# Patient Record
Sex: Male | Born: 1965
Health system: Southern US, Community
[De-identification: ages and names within clinical notes are randomized; demographics above are authoritative.]

## PROBLEM LIST (undated history)

## (undated) DIAGNOSIS — I10 Essential (primary) hypertension: Secondary | ICD-10-CM

## (undated) DIAGNOSIS — G8929 Other chronic pain: Secondary | ICD-10-CM

## (undated) DIAGNOSIS — M549 Dorsalgia, unspecified: Secondary | ICD-10-CM

## (undated) DIAGNOSIS — E785 Hyperlipidemia, unspecified: Secondary | ICD-10-CM

## (undated) HISTORY — PX: HERNIA REPAIR: SHX51

## (undated) HISTORY — PX: APPENDECTOMY: SHX54

## (undated) HISTORY — DX: Essential (primary) hypertension: I10

## (undated) HISTORY — DX: Hyperlipidemia, unspecified: E78.5

## (undated) HISTORY — DX: Dorsalgia, unspecified: M54.9

## (undated) HISTORY — DX: Other chronic pain: G89.29

---

## 1998-07-08 ENCOUNTER — Encounter: Payer: Self-pay | Admitting: Emergency Medicine

## 1998-07-08 ENCOUNTER — Emergency Department (HOSPITAL_COMMUNITY): Admission: EM | Admit: 1998-07-08 | Discharge: 1998-07-08 | Payer: Self-pay | Admitting: Emergency Medicine

## 1999-06-15 ENCOUNTER — Encounter: Payer: Self-pay | Admitting: Emergency Medicine

## 1999-06-15 ENCOUNTER — Emergency Department (HOSPITAL_COMMUNITY): Admission: EM | Admit: 1999-06-15 | Discharge: 1999-06-15 | Payer: Self-pay | Admitting: Emergency Medicine

## 2015-09-12 ENCOUNTER — Telehealth: Payer: Self-pay | Admitting: Internal Medicine

## 2015-09-12 NOTE — Telephone Encounter (Signed)
REMINDER CALL FOR  APPT/PHONE IS OFF

## 2015-09-15 ENCOUNTER — Encounter: Payer: Self-pay | Admitting: Internal Medicine

## 2015-09-15 ENCOUNTER — Ambulatory Visit (INDEPENDENT_AMBULATORY_CARE_PROVIDER_SITE_OTHER): Payer: Self-pay | Admitting: Internal Medicine

## 2015-09-15 VITALS — BP 148/93 | HR 72 | Temp 98.3°F | Ht 73.5 in | Wt 228.7 lb

## 2015-09-15 DIAGNOSIS — M549 Dorsalgia, unspecified: Secondary | ICD-10-CM

## 2015-09-15 DIAGNOSIS — R21 Rash and other nonspecific skin eruption: Secondary | ICD-10-CM | POA: Insufficient documentation

## 2015-09-15 DIAGNOSIS — Z0000 Encounter for general adult medical examination without abnormal findings: Secondary | ICD-10-CM

## 2015-09-15 DIAGNOSIS — R3911 Hesitancy of micturition: Secondary | ICD-10-CM

## 2015-09-15 DIAGNOSIS — Z1211 Encounter for screening for malignant neoplasm of colon: Secondary | ICD-10-CM | POA: Insufficient documentation

## 2015-09-15 DIAGNOSIS — R300 Dysuria: Secondary | ICD-10-CM

## 2015-09-15 DIAGNOSIS — I10 Essential (primary) hypertension: Secondary | ICD-10-CM | POA: Insufficient documentation

## 2015-09-15 DIAGNOSIS — Z23 Encounter for immunization: Secondary | ICD-10-CM

## 2015-09-15 DIAGNOSIS — M79672 Pain in left foot: Secondary | ICD-10-CM

## 2015-09-15 DIAGNOSIS — G8929 Other chronic pain: Secondary | ICD-10-CM

## 2015-09-15 MED ORDER — HYDROCHLOROTHIAZIDE 12.5 MG PO CAPS: 12.5000 mg | ORAL_CAPSULE | Freq: Every day | ORAL | Status: DC

## 2015-09-15 MED ORDER — MELOXICAM 7.5 MG PO TABS: 7.5000 mg | ORAL_TABLET | Freq: Every day | ORAL | Status: DC | PRN

## 2015-09-15 MED ORDER — MELOXICAM 7.5 MG PO TABS: 7.5000 mg | ORAL_TABLET | Freq: Every day | ORAL | Status: AC | PRN

## 2015-09-15 MED FILL — HYDROCHLOROTHIAZIDE 12.5 MG: 12.5 | 30 days supply | Qty: 30 | Fill #0

## 2015-09-15 MED FILL — MELOXICAM 7.5 MG TABLET: 7.5 | 30 days supply | Qty: 30 | Fill #0

## 2015-09-15 NOTE — Assessment & Plan Note (Addendum)
HIV negative. Given flu shot today.

## 2015-09-15 NOTE — Patient Instructions (Addendum)
1. Please make a follow up appointment in 4 weeks.   2. Please take all medications as previously prescribed with the following changes:  Start HCTZ 12.5 mg daily for your blood pressure. I sent this prescription to [facility] on [address] in [place].   I will call you if there are any abnormalities with your labs. Otherwise, we will dis cuss them at your next clinic visit in 1 month.   Please take Mobic 7.5 mg daily AS NEEDED for back and foot pain. You can take Tylenol over the counter as needed for pain as well.   It is not safe to take 7-8 Advil daily, this can cause kidney and stomach problems.   3. If you have worsening of your symptoms or new symptoms arise, please call the clinic [phone]), or go to the ER immediately if symptoms are severe.  Hypertension Hypertension is another name for high blood pressure. High blood pressure forces your heart to work harder to pump blood. A blood pressure reading has two numbers, which includes a higher number over a lower number (example: 110/72). HOME CARE   Have your blood pressure rechecked by your doctor.  Only take medicine as told by your doctor. Follow the directions carefully. The medicine does not work as well if you skip doses. Skipping doses also puts you at risk for problems.  Do not smoke.  Monitor your blood pressure at home as told by your doctor. GET HELP IF:  You think you are having a reaction to the medicine you are taking.  You have repeat headaches or feel dizzy.  You have puffiness (swelling) in your ankles.  You have trouble with your vision. GET HELP RIGHT AWAY IF:   You get a very bad headache and are confused.  You feel weak, numb, or faint.  You get chest or belly (abdominal) pain.  You throw up (vomit).  You cannot breathe very well. MAKE SURE YOU:   Understand these instructions.  Will watch your condition.  Will get help right away if you are not doing  well or get worse.   This information is not intended to replace advice given to you by your health care provider. Make sure you discuss any questions you have with your health care provider.   Document Released: 01/12/2008 Document Revised: 07/31/2013 Document Reviewed: 05/18/2013 Elsevier Interactive Patient Education 2016 [employer].  Smoking Cessation, Tips for Success If you are ready to quit smoking, congratulations! You have chosen to help yourself be healthier. Cigarettes bring nicotine, tar, carbon monoxide, and other irritants into your body. Your lungs, heart, and blood vessels will be able to work better without these poisons. There are many different ways to quit smoking. Nicotine gum, nicotine patches, a nicotine inhaler, or nicotine nasal spray can help with physical craving. Hypnosis, support groups, and medicines help break the habit of smoking. WHAT THINGS CAN I DO TO MAKE QUITTING EASIER?  Here are some tips to help you quit for good:  Pick a date when you will quit smoking completely. Tell all of your friends and family about your plan to quit on that date.  Do not try to slowly cut down on the number of cigarettes you are smoking. Pick a quit date and quit smoking completely starting on that day.  Throw away all cigarettes.   Clean and remove all ashtrays from your home, work, and car.  On a card, write down your reasons for quitting. Carry  the card with you and read it when you get the urge to smoke.  Cleanse your body of nicotine. Drink enough water and fluids to keep your urine clear or pale yellow. Do this after quitting to flush the nicotine from your body.  Learn to predict your moods. Do not let a bad situation be your excuse to have a cigarette. Some situations in your life might tempt you into wanting a cigarette.  Never have "just one" cigarette. It leads to wanting another and another. Remind yourself of your decision to quit.  Change habits associated  with smoking. If you smoked while driving or when feeling stressed, try other activities to replace smoking. Stand up when drinking your coffee. Brush your teeth after eating. Sit in a different chair when you read the paper. Avoid alcohol while trying to quit, and try to drink fewer caffeinated beverages. Alcohol and caffeine may urge you to smoke.  Avoid foods and drinks that can trigger a desire to smoke, such as sugary or spicy foods and alcohol.  Ask people who smoke not to smoke around you.  Have something planned to do right after eating or having a cup of coffee. For example, plan to take a walk or exercise.  Try a relaxation exercise to calm you down and decrease your stress. Remember, you may be tense and nervous for the first 2 weeks after you quit, but this will pass.  Find new activities to keep your hands busy. Play with a pen, coin, or rubber band. Doodle or draw things on paper.  Brush your teeth right after eating. This will help cut down on the craving for the taste of tobacco after meals. You can also try mouthwash.   Use oral substitutes in place of cigarettes. Try using lemon drops, carrots, cinnamon sticks, or chewing gum. Keep them handy so they are available when you have the urge to smoke.  When you have the urge to smoke, try deep breathing.  Designate your home as a nonsmoking area.  If you are a heavy smoker, ask your health care provider about a prescription for nicotine chewing gum. It can ease your withdrawal from nicotine.  Reward yourself. Set aside the cigarette money you save and buy yourself something nice.  Look for support from others. Join a support group or smoking cessation program. Ask someone at home or at work to help you with your plan to quit smoking.  Always ask yourself, "Do I need this cigarette or is this just a reflex?" Tell yourself, "Today, I choose not to smoke," or "I do not want to smoke." You are reminding yourself of your decision  to quit.  Do not replace cigarette smoking with electronic cigarettes (commonly called e-cigarettes). The safety of e-cigarettes is unknown, and some may contain harmful chemicals.  If you relapse, do not give up! Plan ahead and think about what you will do the next time you get the urge to smoke. HOW WILL I FEEL WHEN I QUIT SMOKING? You may have symptoms of withdrawal because your body is used to nicotine (the addictive substance in cigarettes). You may crave cigarettes, be irritable, feel very hungry, cough often, get headaches, or have difficulty concentrating. The withdrawal symptoms are only temporary. They are strongest when you first quit but will go away within 10-14 days. When withdrawal symptoms occur, stay in control. Think about your reasons for quitting. Remind yourself that these are signs that your body is healing and getting used to being  without cigarettes. Remember that withdrawal symptoms are easier to treat than the major diseases that smoking can cause.  Even after the withdrawal is over, expect periodic urges to smoke. However, these cravings are generally short lived and will go away whether you smoke or not. Do not smoke! WHAT RESOURCES ARE AVAILABLE TO HELP ME QUIT SMOKING? Your health care provider can direct you to community resources or hospitals for support, which may include:  Group support.  Education.  Hypnosis.  Therapy.   This information is not intended to replace advice given to you by your health care provider. Make sure you discuss any questions you have with your health care provider.   Document Released: 04/23/2004 Document Revised: 08/16/2014 Document Reviewed: 01/11/2013 Elsevier Interactive Patient Education [employer].

## 2015-09-15 NOTE — Assessment & Plan Note (Addendum)
Patient states he has had 1 week of dysuria w/ hesitancy. Has not had previously. Sexually active as recently as 3 months ago. Claims he has had some urethral discharge as well. UA shows 11-20 WBC's, RBC's, crystals. No squamous cells. Will treat for UTI at this time. -Send for urine culture -Start bactrim DS bid for 7 days -GC/chlamydia, HIV negative -Oral hydration given crystals

## 2015-09-15 NOTE — Assessment & Plan Note (Addendum)
BP Readings from Last 3 Encounters:  09/15/15 148/93    Lab Results  Component Value Date   NA 142 09/15/2015   K 4.3 09/15/2015   CREATININE 0.85 09/15/2015    Assessment: Blood pressure control:  Mild elevation. Has been told he has had high blood pressure in the past.   Plan: Medications:  Start HCTZ 12.5 mg daily. Renal function normal.  Educational resources provided:   Info packet with AVS Other plans: RTC in 4 weeks.

## 2015-09-15 NOTE — Progress Notes (Signed)
   Subjective:   Patient ID: Edward Franklin male   DOB: [birth year] 50 y.o.   MRN: [id number]  HPI: Edward Franklin is a 50 y.o. male w/ PMHx of chronic back pain and HTN (not on any medications), presents to the clinic today to establish care for management of his HTN. Today, he says he is feeling well, has chronic back pain which he manages with daily NSAIDS, seems to take 6-8 Advil a day per the patient. Walks with a cane. 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 Otherwise, he says he started having dysuria which began about 1 week ago. He states he is having some issues with hesitancy as well, which also started over the same period of time. Also claims he has had some whitish appearing discharge as well. Was sexually active with one partner 2-3 months ago. No fever, chills, flank pain, nausea, or vomiting. Has never hd a UTI before. Also states he has had a h/o HTN in the past but has never taken medications for this.   Patient smokes 7-10 cigarettes daily, does not drink or use recreational drugs. No significant family history.    Past Medical History  Diagnosis Date  . Hypertension   . Chronic back pain      Current Outpatient Prescriptions  Medication Sig Dispense Refill  . hydrochlorothiazide (MICROZIDE) 12.5 MG capsule Take 1 capsule (12.5 mg total) by mouth daily. 30 capsule 5  . meloxicam (MOBIC) 7.5 MG tablet Take 1 tablet (7.5 mg total) by mouth daily as needed for pain. 30 tablet 2  . sulfamethoxazole-trimethoprim (BACTRIM DS,SEPTRA DS) 800-160 MG tablet Take 1 tablet by mouth 2 (two) times daily. 14 tablet 0   No current facility-administered medications for this visit.    Review of Systems: General: Denies fever, chills, diaphoresis, appetite change and fatigue.  Respiratory: Denies SOB, DOE, cough, and wheezing.   Cardiovascular: Denies chest pain and palpitations.    Gastrointestinal: Denies nausea, vomiting, abdominal pain, and diarrhea.  Genitourinary: Positive for dysuria and hesitancy. Denies increased frequency, and flank pain. Endocrine: Denies hot or cold intolerance, polyuria, and polydipsia. Musculoskeletal: Positive for chronic back pain and gait problem. Denies myalgias, joint swelling, arthralgias.  Skin: Denies pallor, rash and wounds.  Neurological: Denies dizziness, seizures, syncope, weakness, lightheadedness, numbness and headaches.  Psychiatric/Behavioral: Denies mood changes, and sleep disturbances.  Objective:   Physical Exam: Filed Vitals:   09/15/15 1547  BP: 148/93  Pulse: 72  Temp: 98.3 F (36.8 C)  TempSrc: Oral  Height: 6' 1.5" (1.867 m)  Weight: 228 lb 11.2 oz (103.738 kg)  SpO2: 97%    General: Alert, cooperative, NAD. HEENT: PERRL, EOMI. Moist mucus membranes Neck: Full range of motion without pain, supple, no lymphadenopathy or carotid bruits Lungs: Clear to ascultation bilaterally, normal work of respiration, no wheezes, rales, rhonchi Heart: RRR, no murmurs, gallops, or rubs Abdomen: Soft, non-tender, non-distended, BS + Back:  Mild central tenderness over the lumbar spine. No surgical incisions.  Extremities: No cyanosis, clubbing, or edema. Left foot with multiple old surgical scars. Neurologic: Alert & oriented x3, cranial nerves II-XII intact, strength grossly intact, sensation intact to light touch   Assessment & Plan:   Please see problem based assessment and plan.

## 2015-09-16 ENCOUNTER — Encounter: Payer: Self-pay | Admitting: Internal Medicine

## 2015-09-16 LAB — BMP8+ANION GAP
Anion Gap: 20 mmol/L — ABNORMAL HIGH (ref 10.0–18.0)
BUN/Creatinine Ratio: 14 (ref 9–20)
BUN: 12 mg/dL (ref 6–24)
CO2: 22 mmol/L (ref 18–29)
Calcium: 9.4 mg/dL (ref 8.7–10.2)
Chloride: 100 mmol/L (ref 96–106)
Creatinine, Ser: 0.85 mg/dL (ref 0.76–1.27)
GFR calc Af Amer: 117 mL/min/{1.73_m2} (ref >59)
GFR calc non Af Amer: 102 mL/min/{1.73_m2} (ref >59)
Glucose: 92 mg/dL (ref 65–99)
Potassium: 4.3 mmol/L (ref 3.5–5.2)
Sodium: 142 mmol/L (ref 134–144)

## 2015-09-16 LAB — CBC WITH DIFFERENTIAL/PLATELET
Basophils Absolute: 0.1 10*3/uL (ref 0.0–0.2)
Basos: 1 %
EOS (ABSOLUTE): 0.2 10*3/uL (ref 0.0–0.4)
Eos: 2 %
Hematocrit: 43.2 % (ref 37.5–51.0)
Hemoglobin: 14.9 g/dL (ref 12.6–17.7)
Immature Grans (Abs): 0 10*3/uL (ref 0.0–0.1)
Immature Granulocytes: 0 %
Lymphocytes Absolute: 4 10*3/uL — ABNORMAL HIGH (ref 0.7–3.1)
Lymphs: 38 %
MCH: 32 pg (ref 26.6–33.0)
MCHC: 34.5 g/dL (ref 31.5–35.7)
MCV: 93 fL (ref 79–97)
Monocytes Absolute: 0.7 10*3/uL (ref 0.1–0.9)
Monocytes: 6 %
Neutrophils Absolute: 5.5 10*3/uL (ref 1.4–7.0)
Neutrophils: 53 %
Platelets: 316 10*3/uL (ref 150–379)
RBC: 4.66 x10E6/uL (ref 4.14–5.80)
RDW: 12.5 % (ref 12.3–15.4)
WBC: 10.5 10*3/uL (ref 3.4–10.8)

## 2015-09-16 LAB — URINE CYTOLOGY ANCILLARY ONLY
CHLAMYDIA, DNA PROBE: NEGATIVE
Neisseria Gonorrhea: NEGATIVE

## 2015-09-16 LAB — MICROSCOPIC EXAMINATION
Casts: NONE SEEN /lpf
Epithelial Cells (non renal): NONE SEEN /hpf (ref 0–10)

## 2015-09-16 LAB — URINALYSIS, ROUTINE W REFLEX MICROSCOPIC
Bilirubin, UA: NEGATIVE
Glucose, UA: NEGATIVE
Ketones, UA: NEGATIVE
Nitrite, UA: NEGATIVE
Protein, UA: NEGATIVE
Specific Gravity, UA: 1.027 (ref 1.005–1.030)
Urobilinogen, Ur: 0.2 mg/dL (ref 0.2–1.0)
pH, UA: 5.5 (ref 5.0–7.5)

## 2015-09-16 LAB — HIV ANTIBODY (ROUTINE TESTING W REFLEX): HIV Screen 4th Generation wRfx: NONREACTIVE

## 2015-09-16 MED ORDER — SULFAMETHOXAZOLE-TRIMETHOPRIM 800-160 MG PO TABS: 1.0000 | ORAL_TABLET | Freq: Two times a day (BID) | ORAL | Status: DC

## 2015-09-16 NOTE — Assessment & Plan Note (Addendum)
Patient states that he has had back pain for many years, after a work related accident. He takes excessive NSAIDS daily to help control the pain. States he takes 6-8 Advil every day. He feels this does in fact control the pain. Patient only with mild tenderness to palpation over the lumbar spine. No significant limitations to ROM.  -Discussed decreasing NSAID use  -Start Mobic 7.5 mg daily prn for back pain -Discussed using Tylenol prn OTC for pain other than excessive NSAID use.

## 2015-09-17 LAB — SPECIMEN STATUS REPORT

## 2015-09-18 NOTE — Progress Notes (Signed)
Internal Medicine Clinic Attending  Case discussed with Dr. Jones at the time of the visit.  We reviewed the resident's history and exam and pertinent patient test results.  I agree with the assessment, diagnosis, and plan of care documented in the resident's note.  

## 2015-09-19 LAB — URINE CULTURE

## 2015-10-06 ENCOUNTER — Encounter: Payer: Self-pay | Admitting: Internal Medicine

## 2015-10-06 ENCOUNTER — Ambulatory Visit (INDEPENDENT_AMBULATORY_CARE_PROVIDER_SITE_OTHER): Payer: Self-pay | Admitting: Internal Medicine

## 2015-10-06 ENCOUNTER — Ambulatory Visit: Payer: Self-pay

## 2015-10-06 VITALS — BP 143/86 | HR 74 | Temp 98.0°F | Ht 73.0 in | Wt 228.0 lb

## 2015-10-06 DIAGNOSIS — I10 Essential (primary) hypertension: Secondary | ICD-10-CM

## 2015-10-06 DIAGNOSIS — R202 Paresthesia of skin: Secondary | ICD-10-CM

## 2015-10-06 DIAGNOSIS — E785 Hyperlipidemia, unspecified: Secondary | ICD-10-CM | POA: Insufficient documentation

## 2015-10-06 DIAGNOSIS — G5690 Unspecified mononeuropathy of unspecified upper limb: Secondary | ICD-10-CM | POA: Insufficient documentation

## 2015-10-06 DIAGNOSIS — B351 Tinea unguium: Secondary | ICD-10-CM

## 2015-10-06 DIAGNOSIS — R200 Anesthesia of skin: Secondary | ICD-10-CM

## 2015-10-06 MED ORDER — HYDROCHLOROTHIAZIDE 12.5 MG PO CAPS: 25.0000 mg | ORAL_CAPSULE | Freq: Every day | ORAL | Status: DC

## 2015-10-06 MED ORDER — TERBINAFINE HCL 250 MG PO TABS: 250.0000 mg | ORAL_TABLET | Freq: Every day | ORAL | Status: DC

## 2015-10-06 MED ORDER — HYDROCHLOROTHIAZIDE 25 MG PO TABS: 25.0000 mg | ORAL_TABLET | Freq: Every day | ORAL | Status: DC

## 2015-10-06 NOTE — Patient Instructions (Signed)
1. Please make a follow up appointment for 6 weeks.   2. Please take all medications as previously prescribed with the following changes:  Increase HCTZ to 25 mg daily (2 pills once daily)  Start taking Terbinafine 250 mg daily for a total of 12 weeks.   If any of your labs come back abnormal I will call you.   3. If you have worsening of your symptoms or new symptoms arise, please call the clinic [phone]), or go to the ER immediately if symptoms are severe.  You have done a great job in taking all your medications. Please continue to do this.

## 2015-10-07 ENCOUNTER — Encounter: Payer: Self-pay | Admitting: Internal Medicine

## 2015-10-07 LAB — LIPID PANEL
CHOL/HDL RATIO: 8.3 ratio — AB (ref 0.0–5.0)
Cholesterol, Total: 217 mg/dL — ABNORMAL HIGH (ref 100–199)
HDL: 26 mg/dL — AB (ref >39)
LDL Calculated: 127 mg/dL — ABNORMAL HIGH (ref 0–99)
Triglycerides: 318 mg/dL — ABNORMAL HIGH (ref 0–149)
VLDL Cholesterol Cal: 64 mg/dL — ABNORMAL HIGH (ref 5–40)

## 2015-10-07 LAB — TSH: TSH: 0.986 u[IU]/mL (ref 0.450–4.500)

## 2015-10-07 LAB — VITAMIN B12: VITAMIN B 12: 400 pg/mL (ref 211–946)

## 2015-10-07 NOTE — Assessment & Plan Note (Signed)
Has thickening of 1-2 fingernails with discoloration, as well as 1-2 toenails as well. Says this has been present for quite some time.  -Start Terbinafine 250 mg daily  for 12 week course (given presence of both finger and toenail involvement) -RTC in 6 weeks -Patient understands he may not see benefit for some time

## 2015-10-07 NOTE — Progress Notes (Signed)
   Subjective:   Patient ID: Edward Franklin male   DOB: 1966/03/19 50 y.o.   MRN: [id number]  HPI: [name] is a 50 y.o. male w/ PMHx of chronic back pain, HLD, and HTN (not on any medications), presents to the clinic today for follow up of his HTN. Patient was seen on 09/15/15 as a new patient in the clinic, at which time he was started on HCTZ 12.5 mg daily. Today, he says he has tolerated this medication well, BP still mild/moderately elevated.   He states he has ben taking Mobic intermittently and says that he has stopped taking OTC NSAIDS and doing better off of this. Back pain is controlled.   He also admits to some tingling and numbness in his hands bilaterally. No back pain or weakness. No swelling. Says this has been present for some time, comes and goes, not associated with anything in particular.    Past Medical History  Diagnosis Date  . Hypertension   . Chronic back pain   . Hyperlipidemia      Current Outpatient Prescriptions  Medication Sig Dispense Refill  . hydrochlorothiazide (HYDRODIURIL) 25 MG tablet Take 1 tablet (25 mg total) by mouth daily. 30 tablet 5  . meloxicam (MOBIC) 7.5 MG tablet Take 1 tablet (7.5 mg total) by mouth daily as needed for pain. 30 tablet 2  . terbinafine (LAMISIL) 250 MG tablet Take 1 tablet (250 mg total) by mouth daily. For 12 weeks 120 tablet 0   No current facility-administered medications for this visit.    Review of Systems  General: Denies fever, diaphoresis, appetite change, and fatigue.  Respiratory: Denies SOB, cough, and wheezing.   Cardiovascular: Denies chest pain and palpitations.  Gastrointestinal: Denies nausea, vomiting, abdominal pain, and diarrhea Musculoskeletal: Denies myalgias, arthralgias, back pain, and gait problem.  Neurological: Positive for hand paresthesias. Denies dizziness, syncope, weakness, lightheadedness, and headaches.  Psychiatric/Behavioral: Denies mood changes, sleep disturbance, and  agitation.   Objective:   Physical Exam: Filed Vitals:   10/06/15 1424  BP: 143/86  Pulse: 74  Temp: 98 F (36.7 C)  TempSrc: Oral  Height: 6\' 1"  (1.854 m)  Weight: 228 lb (103.42 kg)  SpO2: 98%    General: Alert, cooperative, NAD. Walks with a cane.  HEENT: PERRL, EOMI. Moist mucus membranes Neck: Full range of motion without pain, supple, no lymphadenopathy or carotid bruits Lungs: Clear to ascultation bilaterally, normal work of respiration, no wheezes, rales, rhonchi Heart: RRR, no murmurs, gallops, or rubs Abdomen: Soft, non-tender, non-distended, BS + Back:  Mild central tenderness over the lumbar spine. No surgical incisions.  Extremities: No cyanosis, clubbing, or edema. Left foot with multiple old surgical scars. Neurologic: Alert & oriented x3, cranial nerves II-XII intact, strength grossly intact, sensation intact to light touch   Assessment & Plan:   Please see problem based assessment and plan.

## 2015-10-07 NOTE — Assessment & Plan Note (Signed)
BP Readings from Last 3 Encounters:  10/06/15 143/86  09/15/15 148/93    Lab Results  Component Value Date   NA 142 09/15/2015   K 4.3 09/15/2015   CREATININE 0.85 09/15/2015    Assessment: Blood pressure control:  Mild elevation Comments: Recently started on HCTZ 12.5 mg daily. Has been compliant with this since his last visit.   Plan: Medications:  Increase HCTZ to 25 mg daily.  Other plans: RTC in 6 weeks. May need another agent. Also suspect his BP will improve with decreased NSAID use.

## 2015-10-07 NOTE — Assessment & Plan Note (Signed)
Description very non-specific, says he gets some numbness in his fingers sometimes for hours at a time, which will then go away. No neck pain, arm swelling, or weakness in the UE's. Neuro exam with no focal deficits. Checked B12, TSH, both within normal limits. Glucose controlled on most recent BMP, do not suspect DM type II at this time. No LE neuropathy symptoms.  -Can consider neck imaging to determine presence of degenerative changes and osteophyte formation as this may be related to cervical radiculopathy. If this is the case, may be able to refer to sports medicine/PM&R for injection in the future.

## 2015-10-07 NOTE — Assessment & Plan Note (Signed)
Patient states he has been told he has high cholesterol in the past. Checked lipid profile, results as follows:  Lipid Panel     Component Value Date/Time   CHOL 217* 10/06/2015 1544   TRIG 318* 10/06/2015 1544   HDL 26* 10/06/2015 1544   CHOLHDL 8.3* 10/06/2015 1544   LDLCALC 127* 10/06/2015 1544  Based on BP, total cholesterol, HDL, and smoking history, ASCVD 10 year risk is 22.7%, lifetime risk is 69%. Qualifies for moderate/high intensity statin.  -Discuss starting statin therapy at next clinic visit in 6 weeks.

## 2015-10-08 NOTE — Progress Notes (Signed)
Internal Medicine Clinic Attending  Case discussed with Dr. Jones soon after the resident saw the patient.  We reviewed the resident's history and exam and pertinent patient test results.  I agree with the assessment, diagnosis, and plan of care documented in the resident's note. 

## 2015-10-13 ENCOUNTER — Ambulatory Visit: Payer: Self-pay

## 2015-10-20 ENCOUNTER — Telehealth: Payer: Self-pay | Admitting: Internal Medicine

## 2015-10-20 NOTE — Telephone Encounter (Signed)
APPT. REMINDER CALL, LMTCB °

## 2015-10-21 ENCOUNTER — Ambulatory Visit: Payer: Self-pay

## 2016-07-05 ENCOUNTER — Encounter: Payer: Self-pay | Admitting: Internal Medicine

## 2017-08-17 ENCOUNTER — Ambulatory Visit: Payer: Self-pay

## 2017-09-12 ENCOUNTER — Ambulatory Visit (INDEPENDENT_AMBULATORY_CARE_PROVIDER_SITE_OTHER): Payer: Self-pay | Admitting: Internal Medicine

## 2017-09-12 ENCOUNTER — Other Ambulatory Visit: Payer: Self-pay

## 2017-09-12 ENCOUNTER — Ambulatory Visit: Payer: Medicaid Other

## 2017-09-12 ENCOUNTER — Encounter: Payer: Self-pay | Admitting: Internal Medicine

## 2017-09-12 VITALS — BP 164/102 | HR 80 | Temp 98.1°F | Ht 73.0 in | Wt 227.8 lb

## 2017-09-12 DIAGNOSIS — R5383 Other fatigue: Secondary | ICD-10-CM

## 2017-09-12 DIAGNOSIS — Z79899 Other long term (current) drug therapy: Secondary | ICD-10-CM

## 2017-09-12 DIAGNOSIS — I10 Essential (primary) hypertension: Secondary | ICD-10-CM

## 2017-09-12 DIAGNOSIS — R490 Dysphonia: Secondary | ICD-10-CM

## 2017-09-12 DIAGNOSIS — E785 Hyperlipidemia, unspecified: Secondary | ICD-10-CM

## 2017-09-12 DIAGNOSIS — R12 Heartburn: Secondary | ICD-10-CM

## 2017-09-12 DIAGNOSIS — R093 Abnormal sputum: Secondary | ICD-10-CM

## 2017-09-12 DIAGNOSIS — R05 Cough: Secondary | ICD-10-CM

## 2017-09-12 DIAGNOSIS — R5381 Other malaise: Secondary | ICD-10-CM

## 2017-09-12 DIAGNOSIS — F17210 Nicotine dependence, cigarettes, uncomplicated: Secondary | ICD-10-CM

## 2017-09-12 MED ORDER — HYDROCHLOROTHIAZIDE 25 MG PO TABS: 25.0000 mg | ORAL_TABLET | Freq: Every day | ORAL | 0 refills | Status: DC

## 2017-09-12 MED ORDER — PANTOPRAZOLE SODIUM 40 MG PO TBEC: 40.0000 mg | DELAYED_RELEASE_TABLET | Freq: Every day | ORAL | 1 refills | Status: DC

## 2017-09-12 MED ORDER — NICOTINE 21 MG/24HR TD PT24: 21.0000 mg | MEDICATED_PATCH | TRANSDERMAL | 0 refills | Status: AC

## 2017-09-12 NOTE — Assessment & Plan Note (Addendum)
Patient is not on a statin.  Last lipid panel done 2 years ago showing cholesterol 217, HDL 26, and LDL 127.  Plan -Encouraged lifestyle modification: Diet and exercise, started on nicotine patches for smoking cessation at this visit -Recheck lipid panel  Addendum: Lipid panel showing cholesterol 201, HDL 31, LDL 107, and triglycerides 317.  Goal is to encourage lifestyle modifications at this time.  Unable to call as there is no phone number listed for the patient in the chart. -Recommend repeating lipid panel in a year to determine whether it is appropriate to start statin therapy

## 2017-09-12 NOTE — Assessment & Plan Note (Signed)
HPI Patient reports having intermittent hoarseness of voice for the past 6 months.  He believes this occurs usually in the mornings and his voice gets better later during the day.  Also reports having associated fatigue.  He has also been experiencing night sweats for the past 2 months.  States he was told he had TB in 2000/ 2001 as a part of his immigration medical checkup and was treated with a medication for 6 months.  He does not remember having any symptoms at that time.  At present, he denies having any fevers, chills, hemoptysis, or weight loss.  Reports having occasional cough productive of gray colored sputum; no cough recently.  Denies having any rhinorrhea, sneezing, or sore throat.  Reports having GERD symptoms very occasionally.  He was in [place] for the past 15 months.  Reports smoking 10-12 cigarettes/day for the past 24 years.  Discussed smoking cessation and patient is interested in quitting.  Assessment There is concern for a possible lung mass causing compression of the recurrent laryngeal nerve in the setting of cigarette smoking.  Night sweats and fatigue could also possibly indicate active TB infection in an immigrant. Based on the history, it seems he was treated for latent TB in the past.  Other benign causes for hoarseness of voice include possible GERD.  No symptoms to suggest an upper respiratory tract infection or allergies.  Plan -Trial of Protonix 40 mg daily for 4-6 weeks to see if it causes any improvement in his hoarseness of voice -Check QuantiFERON gold -Chest x-ray to rule out a lung mass or active TB -Smoking cessation: NicoDerm 21 mg daily patch for the next 6 weeks.  Prescribe lower dose at future visit based on response.

## 2017-09-12 NOTE — Patient Instructions (Addendum)
[name] it was nice meeting you today.  -Start taking hydrochlorothiazide 25 mg once daily for your high blood pressure.  -Take Protonix 40 mg once daily for acid reflux  -Start using a nicotine patch daily as instructed  -I have ordered a chest x-ray.  Our clinic will get it scheduled for you as soon as possible.  -Return for a follow-up visit in 3-4 weeks so that we can recheck your blood pressure and do additional blood work.

## 2017-09-12 NOTE — Assessment & Plan Note (Signed)
BP Readings from Last 3 Encounters:  09/12/17 (!) 184/98  10/06/15 (!) 143/86  09/15/15 (!) 148/93    Lab Results  Component Value Date   NA 142 09/15/2015   K 4.3 09/15/2015   CREATININE 0.85 09/15/2015    Assessment: Blood pressure control:  Uncontrolled Progress toward BP goal:   Deteriorated Comments: Patient was last seen in the clinic about 2 years ago.  At that time, his medication regimen included hydrochlorothiazide 25 mg daily.  States he has not taken any medications for over a year now.  Initial blood pressure 184/98 and repeat 164/102 at this visit.  Plan: Medications: Restart hydrochlorothiazide 25 mg daily Educational resources provided:   Educated patient about healthy eating and exercise. Emphasized the importance of weight loss.  Other plans: BMP at next visit to assess renal function and electrolyte status.

## 2017-09-12 NOTE — Progress Notes (Signed)
Internal Medicine Clinic Attending  Case discussed with Dr. Marlowe Sax at the time of the visit.  We reviewed the resident's history and exam and pertinent patient test results.  I agree with the assessment, diagnosis, and plan of care documented in the resident's note. Head and neck cancer would also need to be on diff dx for hoarseness.

## 2017-09-12 NOTE — Progress Notes (Signed)
   CC: Hoarseness of voice  HPI:  Edward Franklin is a 52 y.o. male with a past medical history of hypertension, hyperlipidemia last seen in the clinic about 2 years ago presenting for a follow-up.  He is complaining of hoarseness of voice.  Hypertension and hyperlipidemia were also discussed. Please see problem based charting for the status of the patient's current and chronic medical conditions.   Past Medical History:  Diagnosis Date  . Chronic back pain   . Hyperlipidemia   . Hypertension    Review of Systems:  Review of Systems  Constitutional: Positive for malaise/fatigue. Negative for chills, fever and weight loss.  HENT: Negative for congestion and sore throat.        Hoarseness of voice  Respiratory: Positive for cough (occasional ) and sputum production (occasional ). Negative for hemoptysis.   Gastrointestinal: Positive for heartburn (Occasional ).    Physical Exam:  Vitals:   09/12/17 1009  BP: (!) 184/98  Pulse: 70  Temp: 98.1 F (36.7 C)  TempSrc: Oral  Weight: 227 lb 12.8 oz (103.3 kg)  Height: 6\' 1"  (1.854 m)   Physical Exam  Constitutional: He is oriented to person, place, and time. He appears well-developed and well-nourished. No distress.  HENT:  Head: Normocephalic and atraumatic.  Mouth/Throat: No oropharyngeal exudate.  Mild oropharyngeal erythema.  Eyes: Right eye exhibits no discharge. Left eye exhibits no discharge.  Cardiovascular: Normal rate, regular rhythm and intact distal pulses. Exam reveals no gallop and no friction rub.  No murmur heard. Pulmonary/Chest: Effort normal and breath sounds normal. No respiratory distress. He has no wheezes. He has no rales.  Abdominal: Soft. Bowel sounds are normal. He exhibits no distension. There is no tenderness.  Musculoskeletal: He exhibits no edema.  Neurological: He is alert and oriented to person, place, and time.  Skin: Skin is warm and dry.    Assessment & Plan:   See Encounters Tab for problem  based charting.  Patient discussed with Dr. Lynnae January

## 2017-09-13 LAB — LIPID PANEL
Chol/HDL Ratio: 6.5 ratio — ABNORMAL HIGH (ref 0.0–5.0)
Cholesterol, Total: 201 mg/dL — ABNORMAL HIGH (ref 100–199)
HDL: 31 mg/dL — ABNORMAL LOW (ref >39)
LDL CALC: 107 mg/dL — AB (ref 0–99)
Triglycerides: 317 mg/dL — ABNORMAL HIGH (ref 0–149)
VLDL CHOLESTEROL CAL: 63 mg/dL — AB (ref 5–40)

## 2017-09-15 ENCOUNTER — Encounter: Payer: Self-pay | Admitting: Internal Medicine

## 2017-09-15 LAB — QUANTIFERON-TB GOLD PLUS
QuantiFERON Nil Value: 0.05 IU/mL
QuantiFERON TB1 Ag Value: 8.03 IU/mL
QuantiFERON TB2 Ag Value: 7.73 IU/mL
QuantiFERON-TB Gold Plus: POSITIVE — AB

## 2017-09-19 ENCOUNTER — Ambulatory Visit (INDEPENDENT_AMBULATORY_CARE_PROVIDER_SITE_OTHER): Payer: Self-pay | Admitting: Internal Medicine

## 2017-09-19 ENCOUNTER — Ambulatory Visit (HOSPITAL_COMMUNITY)
Admission: RE | Admit: 2017-09-19 | Discharge: 2017-09-19 | Disposition: A | Discharge status: Home / Self Care | Payer: Medicaid Other | Source: Ambulatory Visit | Attending: Internal Medicine | Admitting: Internal Medicine

## 2017-09-19 ENCOUNTER — Encounter: Payer: Self-pay | Admitting: Internal Medicine

## 2017-09-19 VITALS — BP 169/102 | HR 74 | Temp 97.8°F | Ht 73.0 in | Wt 231.1 lb

## 2017-09-19 DIAGNOSIS — A159 Respiratory tuberculosis unspecified: Secondary | ICD-10-CM

## 2017-09-19 DIAGNOSIS — R490 Dysphonia: Secondary | ICD-10-CM

## 2017-09-19 DIAGNOSIS — Z227 Latent tuberculosis: Secondary | ICD-10-CM | POA: Insufficient documentation

## 2017-09-19 DIAGNOSIS — F17210 Nicotine dependence, cigarettes, uncomplicated: Secondary | ICD-10-CM

## 2017-09-19 DIAGNOSIS — A4159 Other Gram-negative sepsis: Secondary | ICD-10-CM

## 2017-09-19 DIAGNOSIS — I517 Cardiomegaly: Secondary | ICD-10-CM | POA: Insufficient documentation

## 2017-09-19 DIAGNOSIS — E785 Hyperlipidemia, unspecified: Secondary | ICD-10-CM

## 2017-09-19 DIAGNOSIS — I10 Essential (primary) hypertension: Secondary | ICD-10-CM

## 2017-09-19 DIAGNOSIS — Z8611 Personal history of tuberculosis: Secondary | ICD-10-CM

## 2017-09-19 NOTE — Assessment & Plan Note (Signed)
Assessment The patient presented today with a blood pressure of 169/102.  Patient was previously started on hydrochlorothiazide 25 mg daily.  However, the patient states that he did not pick up his medication and start using it yet.  Plan Course the patient to start using hydrochlorothiazide 25 mg daily as prescribed

## 2017-09-19 NOTE — Assessment & Plan Note (Addendum)
Assessment The patient presented with a one month history of productive cough.  States that he has been having chills, fatigue, headaches, and night sweats for the past 6 months.  He does not recall having any fevers and he has not had any significant weight loss.    The patient states that he  tested positive for TB in 2000 during an immigration physical and he was treated with 2 different medications of which she does not remember the name.  She does not recall whether he had a BCG vaccine however a vaccination scar was noted on his left upper extremity.  At previous visit QuantiFERON gold was done and he tested positive for TB.  Plan -Order chest x-ray -Ordered sputum AFB culture and smear -CMP ordered -CBC with differential ordered -HIV test ordered

## 2017-09-19 NOTE — Progress Notes (Signed)
   CC: Tuberculosis follow up  HPI:  [name] is a 52 y.o. immigrant from [place] with essential hypertension, hyperlipidemia who is here for tuberculosis follow up. Please see problem based charting for evaluation, assessment, and plan.   Past Medical History:  Diagnosis Date  . Chronic back pain   . Hyperlipidemia   . Hypertension    Review of Systems:   Chills, night sweats, fatigue Denies subjective fevers  Physical Exam:  Vitals:   09/19/17 1519  BP: (!) 169/102  Pulse: 74  Temp: 97.8 F (36.6 C)  TempSrc: Oral  SpO2: 97%  Weight: 231 lb 1.6 oz (104.8 kg)  Height: 6\' 1"  (1.854 m)   Physical Exam  Constitutional: He appears well-developed and well-nourished. No distress.  HENT:  Head: Normocephalic and atraumatic.  Eyes: Conjunctivae are normal.  Cardiovascular: Normal rate, regular rhythm and normal heart sounds.  Respiratory: Effort normal and breath sounds normal. No respiratory distress. He has no wheezes.  GI: Soft. Bowel sounds are normal. He exhibits no distension. There is no tenderness.  Musculoskeletal: He exhibits no edema.  Neurological: He is alert.  Skin: He is not diaphoretic. No erythema.  Psychiatric: He has a normal mood and affect. His behavior is normal. Judgment and thought content normal.    Assessment & Plan:   See Encounters Tab for problem based charting.  Patient discussed with Dr. Lynnae January

## 2017-09-19 NOTE — Patient Instructions (Addendum)
It was a pleasure to see you today [name]. Please make the following changes:  -Please get chest x-ray done today -Please bring sputum sample back  -Please do not go outside unless wearing mask and try to remain in your house   If you have any questions or concerns, please call our clinic at [phone] between 9am-5pm and after hours call [phone] and ask for the internal medicine resident on call. If you feel you are having a medical emergency please call 911.   Thank you, we look forward to help you remain healthy!  Lars Mage, MD Internal Medicine PGY1  FOLLOW-UP INSTRUCTIONS When: 10/03/17 For: Hoarseness follow up What to bring: Medications      Tuberculosis Tuberculosis (TB) is an infection that harms body tissues. TB usually affects your lungs but can affect other parts of your body. There are two stages of TB:  Active TB. This means you have the symptoms of TB, and it can easily spread from person to person (contagious).  Latent TB. This means you do not have any symptoms of TB, and you are not contagious.  It is important to get treatment no matter what type of TB you have. Follow these instructions at home:  Take your antibiotic medicine as told by your doctor. Finish it all even if you start to feel better.  Keep all follow-up visits as told by your doctor. This is important.  Tell your doctor about all of the people you live with or with whom you have close contact. Your doctor or the Department of Health may talk to these people about being tested for TB.  Rest as needed.  Do not use any tobacco products, including cigarettes, chewing tobacco, or electronic cigarettes. If you need help quitting, ask your doctor.  Avoid close contact with others, especially infants and older people. Do this until your doctor says you will no longer spread TB.  Cover your mouth and nose when you cough or sneeze. Get rid of used tissues as told by your doctor.  Wash  your hands often with soap and water.  Do not go back to work or school until your doctor says it is okay. Contact a doctor if:  You have new symptoms.  You are not hungry.  You feel sick to your stomach (nauseous) or throw up (vomit).  Your pee (urine) is dark yellow.  Your skin or the white part of your eyes turns a yellowish color.  Your symptoms do not go away or get worse.  You have a fever. Get help right away if:  You have chest pain.  You cough up blood.  You have trouble breathing or feel short of breath.  You have a headache or stiff neck. This information is not intended to replace advice given to you by your health care provider. Make sure you discuss any questions you have with your health care provider. Document Released: 05/23/2009 Document Revised: 03/28/2016 Document Reviewed: 10/31/2013 Elsevier Interactive Patient Education  2018 [employer].

## 2017-09-20 ENCOUNTER — Telehealth: Payer: Self-pay | Admitting: Internal Medicine

## 2017-09-20 LAB — CBC WITH DIFFERENTIAL/PLATELET
Basophils Absolute: 0.1 10*3/uL (ref 0.0–0.2)
Basos: 1 %
EOS (ABSOLUTE): 0.2 10*3/uL (ref 0.0–0.4)
Eos: 2 %
HEMATOCRIT: 44.3 % (ref 37.5–51.0)
Hemoglobin: 15.3 g/dL (ref 13.0–17.7)
Immature Grans (Abs): 0 10*3/uL (ref 0.0–0.1)
Immature Granulocytes: 0 %
LYMPHS ABS: 4 10*3/uL — AB (ref 0.7–3.1)
Lymphs: 39 %
MCH: 32.5 pg (ref 26.6–33.0)
MCHC: 34.5 g/dL (ref 31.5–35.7)
MCV: 94 fL (ref 79–97)
MONOS ABS: 0.7 10*3/uL (ref 0.1–0.9)
Monocytes: 7 %
Neutrophils Absolute: 5.4 10*3/uL (ref 1.4–7.0)
Neutrophils: 51 %
PLATELETS: 286 10*3/uL (ref 150–379)
RBC: 4.71 x10E6/uL (ref 4.14–5.80)
RDW: 12.5 % (ref 12.3–15.4)
WBC: 10.3 10*3/uL (ref 3.4–10.8)

## 2017-09-20 LAB — CMP14 + ANION GAP
ALBUMIN: 4.7 g/dL (ref 3.5–5.5)
ALK PHOS: 59 IU/L (ref 39–117)
ALT: 24 IU/L (ref 0–44)
AST: 20 IU/L (ref 0–40)
Albumin/Globulin Ratio: 1.7 (ref 1.2–2.2)
Anion Gap: 13 mmol/L (ref 10.0–18.0)
BILIRUBIN TOTAL: 0.4 mg/dL (ref 0.0–1.2)
BUN/Creatinine Ratio: 14 (ref 9–20)
BUN: 10 mg/dL (ref 6–24)
CHLORIDE: 103 mmol/L (ref 96–106)
CO2: 25 mmol/L (ref 20–29)
Calcium: 9.7 mg/dL (ref 8.7–10.2)
Creatinine, Ser: 0.72 mg/dL — ABNORMAL LOW (ref 0.76–1.27)
GFR calc Af Amer: 124 mL/min/{1.73_m2} (ref >59)
GFR calc non Af Amer: 107 mL/min/{1.73_m2} (ref >59)
Globulin, Total: 2.8 g/dL (ref 1.5–4.5)
Glucose: 84 mg/dL (ref 65–99)
Potassium: 4.6 mmol/L (ref 3.5–5.2)
SODIUM: 141 mmol/L (ref 134–144)
Total Protein: 7.5 g/dL (ref 6.0–8.5)

## 2017-09-20 LAB — HIV ANTIBODY (ROUTINE TESTING W REFLEX): HIV SCREEN 4TH GENERATION: NONREACTIVE

## 2017-09-20 LAB — ACID FAST SMEAR (AFB)

## 2017-09-20 LAB — ACID FAST SMEAR (AFB, MYCOBACTERIA): Acid Fast Smear: NEGATIVE

## 2017-09-20 NOTE — Telephone Encounter (Signed)
Called patient and informed him of his chest x-ray stating that it did not show any acute signs of pulmonary disease. No cavitary lesions noted.   -Pending sputum results

## 2017-09-20 NOTE — Progress Notes (Signed)
Internal Medicine Clinic Attending  Case discussed with Dr. Chundi at the time of the visit.  We reviewed the resident's history and exam and pertinent patient test results.  I agree with the assessment, diagnosis, and plan of care documented in the resident's note. 

## 2017-09-21 ENCOUNTER — Telehealth: Payer: Self-pay | Admitting: *Deleted

## 2017-09-21 ENCOUNTER — Telehealth: Payer: Self-pay

## 2017-09-21 LAB — ACID FAST CULTURE WITH REFLEXED SENSITIVITIES

## 2017-09-21 NOTE — Telephone Encounter (Signed)
Changed to [facility]

## 2017-09-21 NOTE — Telephone Encounter (Signed)
Needs to speak with a nurse about switching pharmacy. Please call pt back.

## 2017-09-21 NOTE — Telephone Encounter (Signed)
Per [facility], pt got hctz 2 days ago at [facility] in [place], he did get pantoprazole at [facility] today. They do not have nicotine patches at [facility]

## 2017-09-23 ENCOUNTER — Telehealth: Payer: Self-pay | Admitting: Internal Medicine

## 2017-09-23 DIAGNOSIS — A159 Respiratory tuberculosis unspecified: Secondary | ICD-10-CM

## 2017-09-23 NOTE — Telephone Encounter (Signed)
Attempted to call patient regarding repeat testing for 3 different AFB sputum and smear samples. However, the patient did not pick up. Will attempt to call again next week.   The patient needs 3 smears to be done to ensure that we did not get false negative with first sample.   -Orders placed for 3 repat afb sputum culture and smear

## 2017-10-03 ENCOUNTER — Other Ambulatory Visit: Payer: Self-pay

## 2017-10-03 ENCOUNTER — Ambulatory Visit (INDEPENDENT_AMBULATORY_CARE_PROVIDER_SITE_OTHER): Payer: Self-pay | Admitting: Internal Medicine

## 2017-10-03 VITALS — BP 152/82 | HR 68 | Temp 97.7°F | Ht 73.0 in | Wt 227.0 lb

## 2017-10-03 DIAGNOSIS — I10 Essential (primary) hypertension: Secondary | ICD-10-CM

## 2017-10-03 DIAGNOSIS — F17210 Nicotine dependence, cigarettes, uncomplicated: Secondary | ICD-10-CM

## 2017-10-03 DIAGNOSIS — Z79899 Other long term (current) drug therapy: Secondary | ICD-10-CM

## 2017-10-03 MED ORDER — AMLODIPINE BESYLATE 5 MG PO TABS: 5.0000 mg | ORAL_TABLET | Freq: Every day | ORAL | 1 refills | Status: DC

## 2017-10-03 NOTE — Patient Instructions (Addendum)
Edward Franklin it was nice seeing you today.  -Start taking amlodipine 5 mg once daily  -Continue taking hydrochlorothiazide as instructed  -Please reduce your daily coffee intake  FOLLOW-UP INSTRUCTIONS When: 1 month For: Blood pressure follow-up What to bring: Medications

## 2017-10-03 NOTE — Assessment & Plan Note (Addendum)
BP Readings from Last 3 Encounters:  10/03/17 (!) 152/82  09/19/17 (!) 169/102  09/12/17 (!) 164/102    Lab Results  Component Value Date   NA 141 09/19/2017   K 4.6 09/19/2017   CREATININE 0.72 (L) 09/19/2017    Assessment: Blood pressure control:  Uncontrolled Comments: Blood pressure continues to be elevated.  He is currently taking hydrochlorothiazide 25 mg daily and reports compliance.  States he started taking the medication last week.  He drinks 2 cups of coffee per day.  Currently smoking 7-8 cigarettes/day.  Discussed smoking cessation but he seems to be in the pre-contemplative stage at this time.  Plan: Medications: Start amlodipine 5 mg daily.  Continue hydrochlorothiazide as above. Educational resources provided:   Educated patient about healthy eating and exercise. Emphasized the importance of weight loss.  Advised him to reduce his caffeine intake. Other plans: Return to the clinic in 4 weeks for a follow-up.  He will need a BMP at his next visit to assess renal function and electrolyte status.

## 2017-10-03 NOTE — Progress Notes (Signed)
   CC: Hypertension follow-up  HPI:  [name] is a 52 y.o. male with a past medical history of conditions listed below presenting to the clinic for a follow-up of hypertension. Please see problem based charting for the status of the patient's current and chronic medical conditions.   Past Medical History:  Diagnosis Date  . Chronic back pain   . Hyperlipidemia   . Hypertension    Review of Systems: Pertinent positives mentioned in HPI. Remainder of all ROS negative.   Physical Exam:  Vitals:   10/03/17 1121 10/03/17 1143  BP: (!) 155/91 (!) 152/82  Pulse: 76 68  Temp: 97.7 F (36.5 C)   TempSrc: Oral   SpO2: 100%   Weight: 227 lb (103 kg)   Height: 6\' 1"  (1.854 m)    Physical Exam  Constitutional: He is oriented to person, place, and time. He appears well-developed and well-nourished. No distress.  HENT:  Head: Normocephalic and atraumatic.  Mouth/Throat: Oropharynx is clear and moist.  Eyes: Right eye exhibits no discharge. Left eye exhibits no discharge.  Cardiovascular: Normal rate, regular rhythm and intact distal pulses.  Pulmonary/Chest: Effort normal and breath sounds normal. No respiratory distress. He has no wheezes. He has no rales.  Abdominal: Soft. Bowel sounds are normal. He exhibits no distension. There is no tenderness.  Musculoskeletal: He exhibits no edema.  Neurological: He is alert and oriented to person, place, and time.  Skin: Skin is warm and dry.    Assessment & Plan:   See Encounters Tab for problem based charting.  Patient discussed with Dr. Daryll Drown

## 2017-10-04 NOTE — Progress Notes (Signed)
Internal Medicine Clinic Attending  Case discussed with Dr. Rathoreat the time of the visit. We reviewed the resident's history and exam and pertinent patient test results. I agree with the assessment, diagnosis, and plan of care documented in the resident's note.  

## 2017-10-31 ENCOUNTER — Encounter: Payer: Self-pay | Admitting: Internal Medicine

## 2017-10-31 ENCOUNTER — Ambulatory Visit (INDEPENDENT_AMBULATORY_CARE_PROVIDER_SITE_OTHER): Payer: Self-pay | Admitting: Internal Medicine

## 2017-10-31 DIAGNOSIS — R221 Localized swelling, mass and lump, neck: Secondary | ICD-10-CM

## 2017-10-31 DIAGNOSIS — Z79899 Other long term (current) drug therapy: Secondary | ICD-10-CM

## 2017-10-31 DIAGNOSIS — J370 Chronic laryngitis: Secondary | ICD-10-CM

## 2017-10-31 DIAGNOSIS — I10 Essential (primary) hypertension: Secondary | ICD-10-CM

## 2017-10-31 DIAGNOSIS — R490 Dysphonia: Secondary | ICD-10-CM

## 2017-10-31 DIAGNOSIS — F17210 Nicotine dependence, cigarettes, uncomplicated: Secondary | ICD-10-CM

## 2017-10-31 DIAGNOSIS — Z8611 Personal history of tuberculosis: Secondary | ICD-10-CM

## 2017-10-31 MED ORDER — AMLODIPINE BESYLATE 10 MG PO TABS: 10.0000 mg | ORAL_TABLET | Freq: Every day | ORAL | 3 refills | Status: DC

## 2017-10-31 NOTE — Assessment & Plan Note (Signed)
BP Readings from Last 3 Encounters:  10/31/17 (!) 142/90  10/03/17 (!) 152/82  09/19/17 (!) 169/102   His blood pressure remained little elevated, although improved from his previous readings.  He is tolerating amlodipine 5 mg very well.  Increase amlodipine to 10 mg daily. Follow-up in 2-3 weeks for blood pressure check.

## 2017-10-31 NOTE — Assessment & Plan Note (Signed)
He has very nonspecific symptoms of muffled voice in the morning which improves as the day possible long.  He has an history of TB which was treated for 10-month. QuantiFERON TB test was positive and chest x-ray was negative for any cavitation or active lesion. On exam he was having a small soft tissue left-sided neck swelling, does not feel like a lymph node.  Apparently that has been investigated in the past in his country [place], no documented results.. According to patient Protonix is not helping with his voice.  -CT neck soft tissue with contrast. -Might need a CT chest in the future. -Reevaluate after imaging.

## 2017-10-31 NOTE — Progress Notes (Signed)
   CC: For follow-up of his hypertension.  HPI:  [name] is a 52 y.o. with past medical history as listed below came to the clinic to follow-up of his hypertension. Amlodipine 5 mg was added to his regimen because of elevated blood pressure.  Patient is compliant with his medications and denies any dizziness or lower extremity edema.  He was complaining of muffled voice, worse when he get out of bed in the morning for the past 4-5 months.  His voice started improving after 2-3 hours, he has tried honey and water to to help with his voice but  nothing seems helping.  Patient is a smoker for 28 years, smokes 8-10 cigarettes/day.  He denies any alcohol or illicit drug use.  He denies any pain or difficulty with swallowing.  He denies any difficulty with breathing.  Unintentional weight loss, cough or night sweats.  He has a soft tissue left-sided neck swelling, apparently was investigated in his country few months ago, patient does not know about the diagnosis.  He has no other complaints.  Past Medical History:  Diagnosis Date  . Chronic back pain   . Hyperlipidemia   . Hypertension    Review of Systems: Negative except mentioned in HPI.  Physical Exam:  Vitals:   10/31/17 1602  BP: (!) 142/90  Pulse: 88  Temp: 98.1 F (36.7 C)  Weight: 223 lb 8 oz (101.4 kg)   General: Vital signs reviewed.  Patient is well-developed and well-nourished, in no acute distress and cooperative with exam.  Head: Normocephalic and atraumatic. Eyes: EOMI, conjunctivae normal, no scleral icterus.  Neck: Supple, trachea midline, normal ROM, no JVD, soft tissue left-sided neck swelling just above the clavicle, does not feel like a lymph node. Cardiovascular: RRR, S1 normal, S2 normal, no murmurs, gallops, or rubs. Pulmonary/Chest: Clear to auscultation bilaterally, no wheezes, rales, or rhonchi. Abdominal: Soft, non-tender, non-distended, BS +, no masses, organomegaly, or guarding present.    Extremities: No lower extremity edema bilaterally,  pulses symmetric and intact bilaterally. No cyanosis or clubbing. Skin: Warm, dry and intact. No rashes or erythema. Psychiatric: Normal mood and affect. speech and behavior is normal. Cognition and memory are normal.  Assessment & Plan:   See Encounters Tab for problem based charting.  Patient discussed with Dr. Dareen Piano.

## 2017-10-31 NOTE — Patient Instructions (Addendum)
Thank you for visiting clinic today. As we discussed your blood pressure is improved but still little high, I am increasing the dose of amlodipine to 10 mg daily. Continue taking hydrochlorothiazide 25 mg daily. I am also sending you to have a CT scan of your neck, as we do not have any records from your country. Please follow-up after the CT scan with your PCP. Also follow-up within 2-3 weeks for your blood pressure recheck.

## 2017-11-01 LAB — AFB CULTURE ONLY REFLEX ID: ACID FAST CULTURE - AFSCU3: NEGATIVE

## 2017-11-01 NOTE — Progress Notes (Signed)
Internal Medicine Clinic Attending  Case discussed with Dr. Amin at the time of the visit.  We reviewed the resident's history and exam and pertinent patient test results.  I agree with the assessment, diagnosis, and plan of care documented in the resident's note.    

## 2017-11-04 ENCOUNTER — Ambulatory Visit (HOSPITAL_COMMUNITY)
Admission: RE | Admit: 2017-11-04 | Discharge: 2017-11-04 | Disposition: A | Discharge status: Home / Self Care | Payer: Medicaid Other | Source: Ambulatory Visit | Attending: Internal Medicine | Admitting: Internal Medicine

## 2017-11-04 DIAGNOSIS — D170 Benign lipomatous neoplasm of skin and subcutaneous tissue of head, face and neck: Secondary | ICD-10-CM | POA: Insufficient documentation

## 2017-11-04 DIAGNOSIS — J370 Chronic laryngitis: Secondary | ICD-10-CM | POA: Insufficient documentation

## 2017-11-04 MED ORDER — IOPAMIDOL (ISOVUE-300) INJECTION 61%
INTRAVENOUS | Status: AC
  Administered 2017-11-04: 75 mL
  Filled 2017-11-04: qty 75

## 2017-11-15 ENCOUNTER — Telehealth: Payer: Self-pay

## 2017-11-15 NOTE — Telephone Encounter (Signed)
Requesting test result. Please call pt back.  

## 2017-11-22 NOTE — Telephone Encounter (Signed)
I am seeing that this message was sent to Dr. Reesa Chew 7 days ago. Was the patient called or does he need to be?

## 2017-11-23 ENCOUNTER — Telehealth: Payer: Self-pay | Admitting: Internal Medicine

## 2017-11-23 NOTE — Telephone Encounter (Signed)
I attempted to call today and was not able to reach patient. Placed a telephone note in chart.

## 2017-11-23 NOTE — Telephone Encounter (Signed)
Attempted to call patient to inform him of CT soft tissue neck with contrast ordered by Dr. Reesa Chew on 11/04/17. The patient has not been picking up call despite repeated attempts.   Will attempt to call again to inform him that his CT scan returned normal without any abnormalities. He has a small lipoma (benign tumor with fat tissue) that is not concerning or explanatory of the symptoms he had.   Need to verify if patient has been continuing to have same symptoms of muffled voice, weight loss, cough, and night sweats.

## 2017-11-24 ENCOUNTER — Telehealth: Payer: Self-pay | Admitting: Internal Medicine

## 2017-11-24 NOTE — Telephone Encounter (Signed)
Left HIPAA compliant message on patient's VM requesting return call. Hubbard Hartshorn, RN, BSN

## 2017-11-24 NOTE — Telephone Encounter (Signed)
Second attempt made today to reach patient to relay info below. Left HIPAA compliant message on patient's VM requesting return call. Hubbard Hartshorn, RN, BSN

## 2017-11-24 NOTE — Telephone Encounter (Signed)
This is being addressed in separate phone encounter from yesterday. Hubbard Hartshorn, RN, BSN

## 2017-11-24 NOTE — Telephone Encounter (Signed)
Was able to reach patient and inform him of his CT neck results. The patient continues to have same symptoms. Therefore, I would like for him to be seen again in clinic please. Please either schedule with me or acc, whichever is sooner. Please call patient to find out which time is most convenient for him. Thank you!  Vahinis

## 2017-11-24 NOTE — Telephone Encounter (Signed)
Please return patient call 

## 2017-11-28 NOTE — Telephone Encounter (Signed)
Appt in [facility] 12/02/2017 at 3:45 PM .L. Silvano Rusk, RN, BSN

## 2017-11-28 NOTE — Telephone Encounter (Signed)
Patient notified by Dr. Maricela Bo in separate phone encounter on 11/24/2017. Hubbard Hartshorn, RN, BSN

## 2017-11-28 NOTE — Telephone Encounter (Signed)
Thank you  Lauren

## 2017-12-02 ENCOUNTER — Encounter: Payer: Self-pay | Admitting: Internal Medicine

## 2017-12-02 ENCOUNTER — Other Ambulatory Visit: Payer: Self-pay

## 2017-12-02 ENCOUNTER — Ambulatory Visit (INDEPENDENT_AMBULATORY_CARE_PROVIDER_SITE_OTHER): Payer: Self-pay | Admitting: Internal Medicine

## 2017-12-02 VITALS — BP 126/88 | HR 75 | Temp 98.2°F | Wt 217.0 lb

## 2017-12-02 DIAGNOSIS — L602 Onychogryphosis: Secondary | ICD-10-CM

## 2017-12-02 DIAGNOSIS — I10 Essential (primary) hypertension: Secondary | ICD-10-CM

## 2017-12-02 DIAGNOSIS — B351 Tinea unguium: Secondary | ICD-10-CM

## 2017-12-02 DIAGNOSIS — E785 Hyperlipidemia, unspecified: Secondary | ICD-10-CM

## 2017-12-02 DIAGNOSIS — L608 Other nail disorders: Secondary | ICD-10-CM

## 2017-12-02 MED ORDER — TERBINAFINE HCL 250 MG PO TABS: 250.0000 mg | ORAL_TABLET | Freq: Every day | ORAL | 0 refills | Status: DC

## 2017-12-02 NOTE — Patient Instructions (Signed)
It was great meeting you today! I'm sorry your fungal infection re-occurred. Unfortunately these infections are hard to keep from coming back.   I'm glad you responded well to Lamisil before in the past. I'm happy to prescribe you a course of this. You will need to be on this for a total of 12 weeks, however it's very important that we monitor your liver function during treatment as there have been rare but reported cases of liver failure and actually death related to this medicine.   I would like you to return to clinic in 6-weeks to check your liver function and get a refill for the remaining 6-weeks.   Have a great weekend! Let us know right away if you develop right-sided abdominal pain, nausea, vomiting or itching.

## 2017-12-02 NOTE — Assessment & Plan Note (Signed)
Assessment: Edward Franklin is here with yellow, thickened and brittle nails of his left foot and hand. Has tried OTC Lamisil for a few weeks without relief and notes prior of nail fungus which was treated successfully with PO Terbinafine. He denied any side effects from the medication and did not experience increased LFTs.   Plan: Discussed the serious adverse effects of PO Terbinafine including liver damage including reported cases of fulminant hepatic failure and even death. He was surprised to hear about the deaths, but is aware of risk of liver damage and still wishes to proceed. He will require a total of 12-weeks of treatment, however will prescribe 6-week course only with instruction to RTC for follow-up and LFTs. Patient agreeable, and actually already has a PCP appointment 6/7.  -Rx for PO Terbinafine 250mg  once daily x 6 weeks sent to pharmacy -Has apt with PCP 6/7 and WILL NEED LFTS AND REFILL

## 2017-12-02 NOTE — Progress Notes (Signed)
   CC: follow-up of nail fungus  HPI:  Mr.Sylus Kaeser is a 52 y.o.  M with HTN, HLD, latent TB and hoarseness who presents today for evaluation of toe and finger nail thickening and discoloration.   For details regarding today's visit and the status of their chronic medical issues, please refer to the assessment and plan.  Past Medical History:  Diagnosis Date  . Chronic back pain   . Hyperlipidemia   . Hypertension    Review of Systems:   General: Denies fevers, chills, weight loss, fatigue HEENT: Denies acute changes in vision, dysphagia Cardiac: Denies CP, SOB Abd: Denies abdominal pain, changes in bowels Extremities: Denies weakness or swelling  Physical Exam: General: Alert, in no acute distress. Pleasant and conversant HEENT: Normocephalic, atraumatic. No icterus, injection or ptosis.  Cardiac: RRR, no Murmur Pulmonary: CTA BL with normal WOB on RA. Able to speak in complete sentences Abd: Soft, non-tender. +bs Extremities: Warm, perfused. No significant pedal edema. Several yellowed, hypertrophic and brittle toenails on left foot and one on left hand. No discharge or foul odor.   Vitals:   12/02/17 1603  BP: 126/88  Pulse: 75  Temp: 98.2 F (36.8 C)  SpO2: 98%  Weight: 217 lb (98.4 kg)   Body mass index is 28.63 kg/m.  Assessment & Plan:   See Encounters Tab for problem based charting.  Patient discussed with Dr. Eppie Gibson

## 2017-12-05 NOTE — Progress Notes (Signed)
Case discussed with Dr. Molt at the time of the visit. We reviewed the resident's history and exam and pertinent patient test results. I agree with the assessment, diagnosis, and plan of care documented in the resident's note. 

## 2017-12-23 ENCOUNTER — Other Ambulatory Visit: Payer: Self-pay | Admitting: *Deleted

## 2017-12-23 MED ORDER — PANTOPRAZOLE SODIUM 40 MG PO TBEC: 40.0000 mg | DELAYED_RELEASE_TABLET | Freq: Every day | ORAL | 1 refills | Status: DC

## 2017-12-23 NOTE — Telephone Encounter (Signed)
Received fax from [facility] requesting refill on omeprazole. Will route to PCP for consideration. Hubbard Hartshorn, RN, BSN

## 2017-12-23 NOTE — Telephone Encounter (Signed)
Received fax requesting refill of amlodipine 5 mg. Rx for amlodipine 10 mg #30 with 3 refills written 10/31/2017 by Dr. Reesa Chew. Print option selected vs fax. Called amlodipine 10 mg as above to [place] at [facility]. Hubbard Hartshorn, RN, BSN

## 2017-12-27 ENCOUNTER — Other Ambulatory Visit: Payer: Self-pay | Admitting: *Deleted

## 2017-12-27 DIAGNOSIS — I10 Essential (primary) hypertension: Secondary | ICD-10-CM

## 2017-12-27 NOTE — Telephone Encounter (Signed)
The patient needs to get LFTs done prior to refill. He should have a 6 week supply.

## 2017-12-28 ENCOUNTER — Other Ambulatory Visit (INDEPENDENT_AMBULATORY_CARE_PROVIDER_SITE_OTHER): Payer: Self-pay

## 2017-12-28 ENCOUNTER — Other Ambulatory Visit: Payer: Self-pay | Admitting: Internal Medicine

## 2017-12-28 DIAGNOSIS — B351 Tinea unguium: Secondary | ICD-10-CM

## 2017-12-28 NOTE — Telephone Encounter (Signed)
Patient's CMP is currently pending. Will refill after results received. Thank you!  Page Pucciarelli

## 2017-12-28 NOTE — Telephone Encounter (Signed)
Called pt - he will be coming today @ 1500 PM for labs. Dr Maricela Bo aware and order placed.

## 2017-12-28 NOTE — Telephone Encounter (Signed)
Called pt - no answer; left message to call back and schedule lab appt per Dr Maricela Bo. Also [facility] informed.

## 2017-12-28 NOTE — Addendum Note (Signed)
Addended by: Truddie Crumble on: 12/28/2017 03:14 PM   Modules accepted: Orders

## 2017-12-29 LAB — CMP14 + ANION GAP
ALK PHOS: 57 IU/L (ref 39–117)
ALT: 17 IU/L (ref 0–44)
ANION GAP: 13 mmol/L (ref 10.0–18.0)
AST: 12 IU/L (ref 0–40)
Albumin/Globulin Ratio: 1.6 (ref 1.2–2.2)
Albumin: 4.3 g/dL (ref 3.5–5.5)
BUN/Creatinine Ratio: 15 (ref 9–20)
BUN: 11 mg/dL (ref 6–24)
Bilirubin Total: 0.5 mg/dL (ref 0.0–1.2)
CO2: 25 mmol/L (ref 20–29)
CREATININE: 0.73 mg/dL — AB (ref 0.76–1.27)
Calcium: 9.6 mg/dL (ref 8.7–10.2)
Chloride: 102 mmol/L (ref 96–106)
GFR calc Af Amer: 123 mL/min/{1.73_m2} (ref >59)
GFR, EST NON AFRICAN AMERICAN: 107 mL/min/{1.73_m2} (ref >59)
GLUCOSE: 108 mg/dL — AB (ref 65–99)
Globulin, Total: 2.7 g/dL (ref 1.5–4.5)
Potassium: 4.2 mmol/L (ref 3.5–5.2)
Sodium: 140 mmol/L (ref 134–144)
Total Protein: 7 g/dL (ref 6.0–8.5)

## 2017-12-29 MED ORDER — AMLODIPINE BESYLATE 10 MG PO TABS: 10.0000 mg | ORAL_TABLET | Freq: Every day | ORAL | 1 refills | Status: DC

## 2017-12-29 MED ORDER — PANTOPRAZOLE SODIUM 40 MG PO TBEC: 40.0000 mg | DELAYED_RELEASE_TABLET | Freq: Every day | ORAL | 1 refills | Status: DC

## 2017-12-29 MED ORDER — TERBINAFINE HCL 250 MG PO TABS: 250.0000 mg | ORAL_TABLET | Freq: Every day | ORAL | 0 refills | Status: DC

## 2017-12-29 MED ORDER — HYDROCHLOROTHIAZIDE 25 MG PO TABS: 25.0000 mg | ORAL_TABLET | Freq: Every day | ORAL | 1 refills | Status: DC

## 2017-12-29 MED ORDER — TERBINAFINE HCL 250 MG PO TABS: 250.0000 mg | ORAL_TABLET | Freq: Every day | ORAL | 0 refills | Status: AC

## 2017-12-29 NOTE — Addendum Note (Signed)
Addended by: Lars Mage on: 12/29/2017 11:34 AM   Modules accepted: Orders

## 2017-12-29 NOTE — Telephone Encounter (Signed)
Has already been filled this morning. Thanks. Patient only needs 6 more weeks of terbinafine, other medications were sent for 90 supply. Thanks!  Chelsea Pedretti

## 2017-12-29 NOTE — Telephone Encounter (Signed)
[place] office - can u schedule pt an appt when he returns from going out of the country. Thanks

## 2017-12-29 NOTE — Telephone Encounter (Signed)
Will refill medication, but please remind patient that he has a clinic appointment scheduled for 01/13/18 and I want to make sure to see him prior to his trip overseas. Thanks!  Elayna Tobler

## 2017-12-29 NOTE — Telephone Encounter (Signed)
Pt will be going out of the country; requesting 90 days supply.

## 2017-12-29 NOTE — Telephone Encounter (Signed)
The patient's liver function appears to be within normal limits. Will refill patient's terbinafine for another 6 weeks (42 days). Patient's other medications hctz, protonix, and amlodipine were refilled for 90 day supply with 1 refill.

## 2017-12-29 NOTE — Telephone Encounter (Signed)
LFTs normal will refill terbinafine for 30 days and see him at follow up 01/13/18.

## 2018-01-13 ENCOUNTER — Encounter: Payer: Medicaid Other | Admitting: Internal Medicine

## 2018-01-13 ENCOUNTER — Encounter: Payer: Self-pay | Admitting: Internal Medicine

## 2018-03-21 ENCOUNTER — Ambulatory Visit: Payer: Medicaid Other

## 2018-03-22 ENCOUNTER — Encounter: Payer: Self-pay | Admitting: Internal Medicine

## 2019-05-09 ENCOUNTER — Ambulatory Visit: Payer: Medicaid Other

## 2020-01-12 IMAGING — DX DG CHEST 2V
2 series · 2 of 2 positions shown · non-contrast
Comparison: Chest x-ray 04/23/2000 report.

CLINICAL DATA: Hoarseness.  Smoking history.

EXAM:
CHEST  2 VIEW

[chest pa]
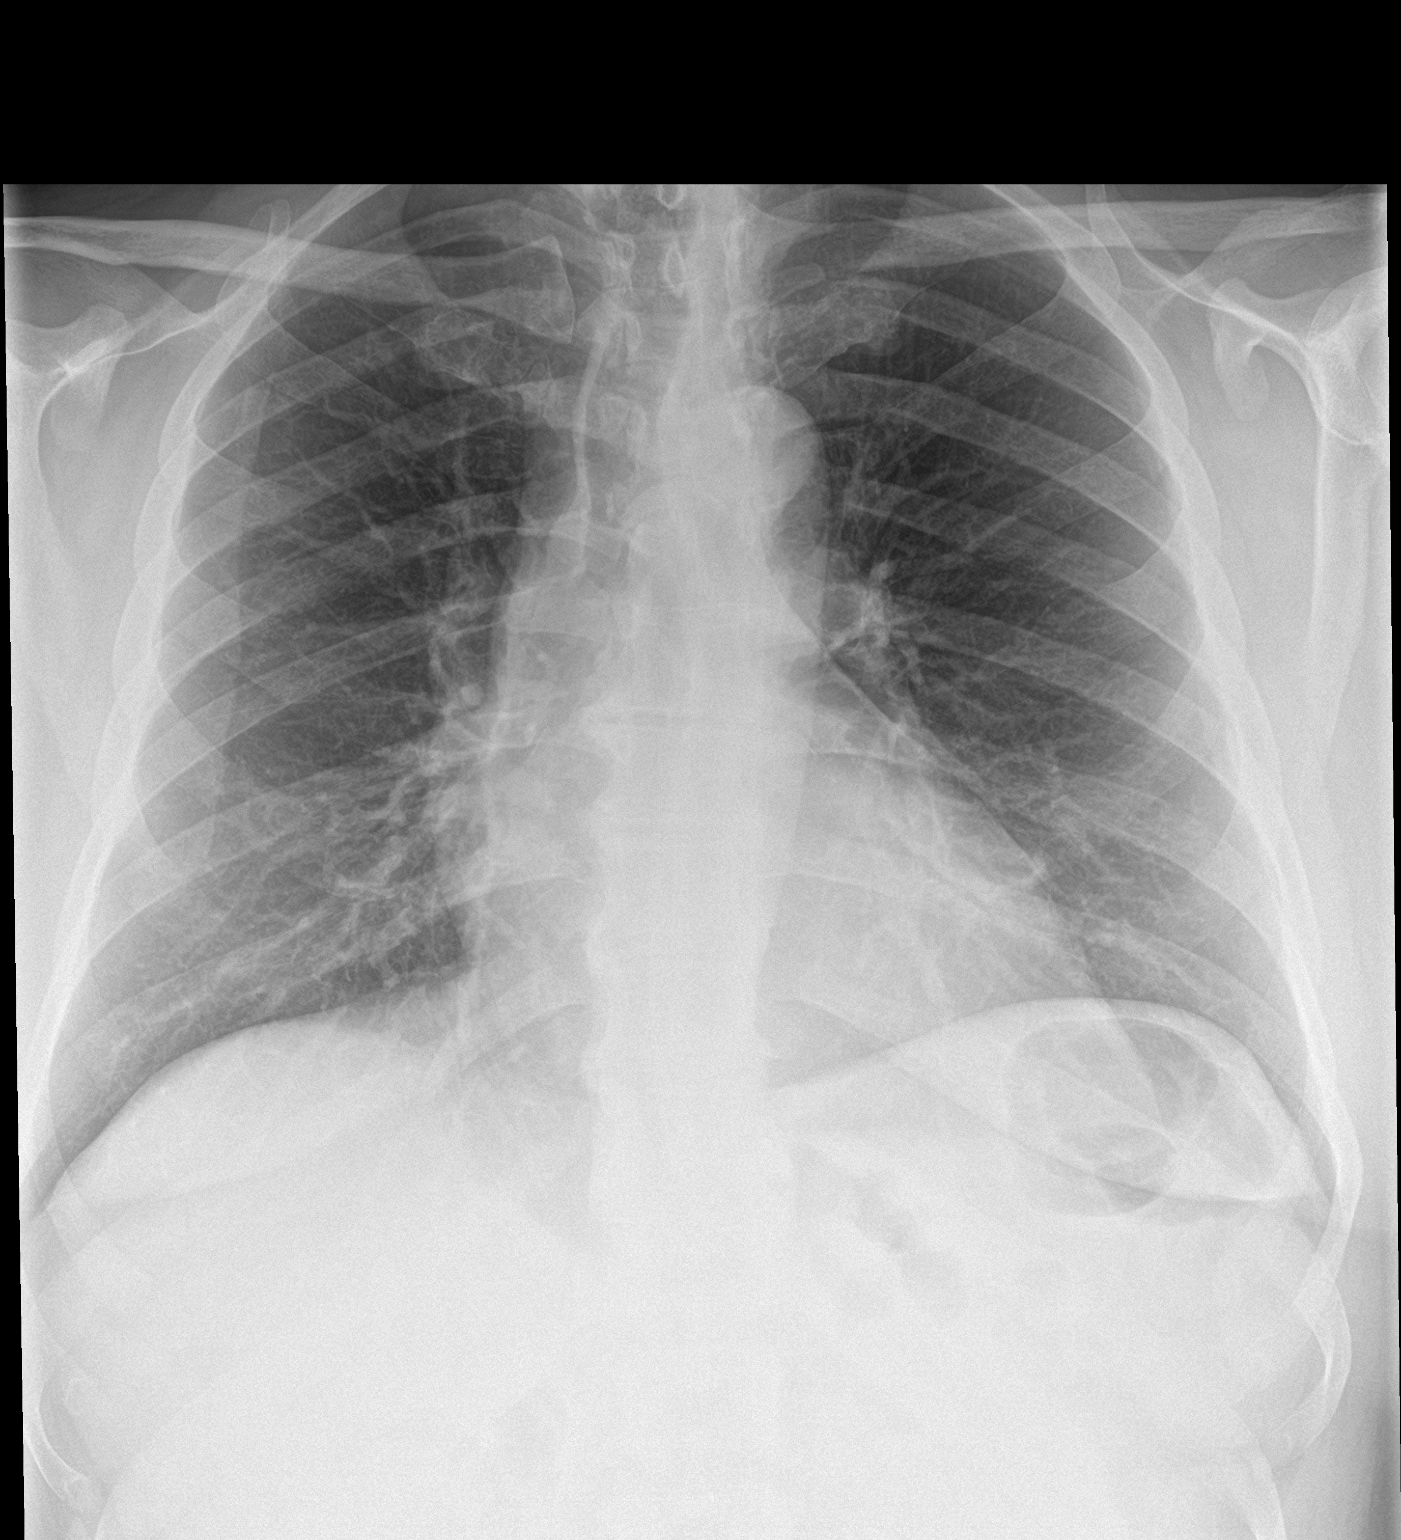

[chest lat]
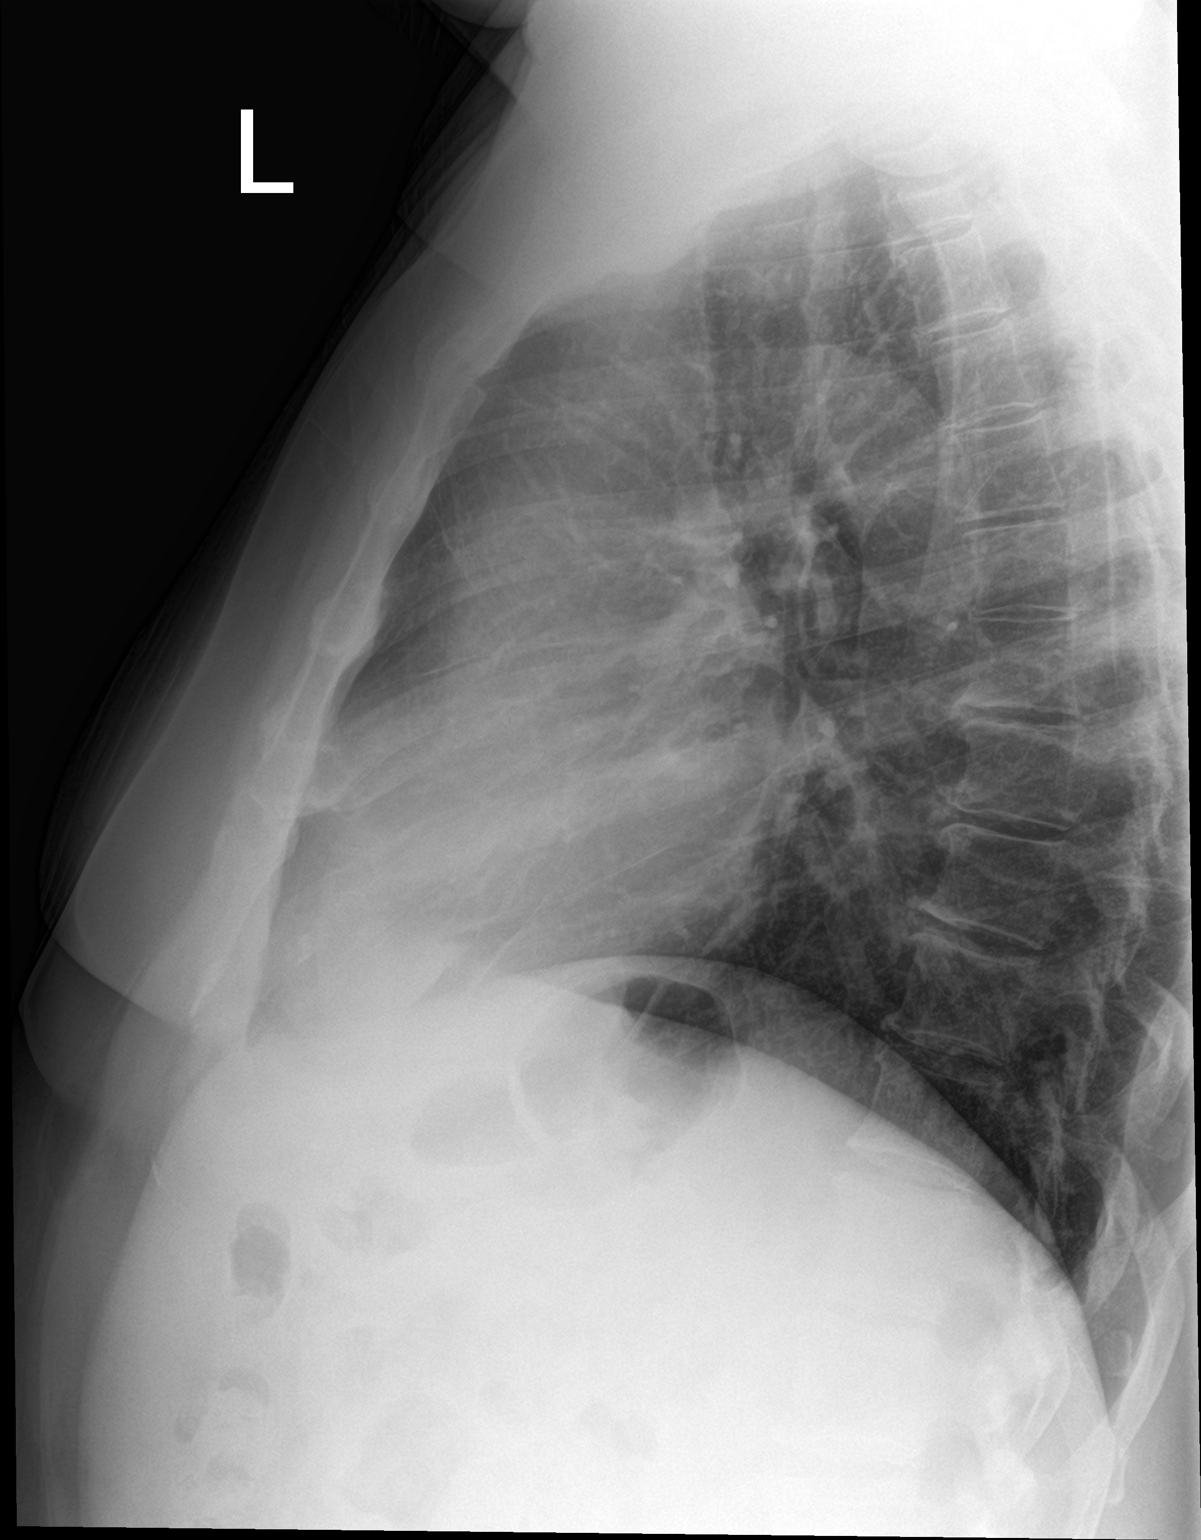

[2 of 2 positions shown; findings below may reference images not displayed]

FINDINGS: Mediastinum and hilar structures normal. Cardiomegaly with normal
pulmonary vascularity. No focal infiltrate. No pleural effusion or
pneumothorax. Degenerative changes thoracic spine.
IMPRESSION: 1.  Cardiomegaly, no pulmonary venous congestion.

2.  No acute pulmonary disease.

## 2020-06-30 ENCOUNTER — Encounter: Payer: Self-pay | Admitting: Internal Medicine

## 2020-06-30 ENCOUNTER — Ambulatory Visit (HOSPITAL_COMMUNITY)
Admission: RE | Admit: 2020-06-30 | Discharge: 2020-06-30 | Disposition: A | Discharge status: Home / Self Care | Payer: Medicaid Other | Source: Ambulatory Visit | Attending: Student in an Organized Health Care Education/Training Program | Admitting: Student in an Organized Health Care Education/Training Program

## 2020-06-30 ENCOUNTER — Ambulatory Visit (INDEPENDENT_AMBULATORY_CARE_PROVIDER_SITE_OTHER): Payer: Self-pay | Admitting: Internal Medicine

## 2020-06-30 ENCOUNTER — Other Ambulatory Visit: Payer: Self-pay

## 2020-06-30 VITALS — BP 186/106 | HR 70 | Temp 98.1°F | Wt 213.8 lb

## 2020-06-30 DIAGNOSIS — M79671 Pain in right foot: Secondary | ICD-10-CM | POA: Insufficient documentation

## 2020-06-30 DIAGNOSIS — I10 Essential (primary) hypertension: Secondary | ICD-10-CM

## 2020-06-30 DIAGNOSIS — Z23 Encounter for immunization: Secondary | ICD-10-CM

## 2020-06-30 DIAGNOSIS — B351 Tinea unguium: Secondary | ICD-10-CM

## 2020-06-30 MED ORDER — DICLOFENAC SODIUM 1 % EX GEL: 2.0000 g | Freq: Four times a day (QID) | CUTANEOUS | 0 refills | Status: DC

## 2020-06-30 MED ORDER — OLMESARTAN MEDOXOMIL-HCTZ 40-25 MG PO TABS: 1.0000 | ORAL_TABLET | Freq: Every day | ORAL | 1 refills | Status: DC

## 2020-06-30 NOTE — Patient Instructions (Signed)
Thank you, Mr.Giles Renton for allowing [place] to provide your care today. Today we discussed high blood pressure, foot pain, and nail fungus.    I have ordered the following labs for you:   Lab Orders     BMP8+Anion Gap   Tests ordered today:    Referrals ordered today:    Referral Orders     Ambulatory referral to Podiatry   I have ordered the following medication/changed the following medications:   Stop the following medications: Medications Discontinued During This Encounter  Medication Reason  . pantoprazole (PROTONIX) 40 MG tablet Patient Preference  . hydrochlorothiazide (HYDRODIURIL) 25 MG tablet Discontinued by provider  . amLODipine (NORVASC) 10 MG tablet Discontinued by provider     Start the following medications: Meds ordered this encounter  Medications  . olmesartan-hydrochlorothiazide (BENICAR HCT) 40-25 MG tablet    Sig: Take 1 tablet by mouth daily.    Dispense:  30 tablet    Refill:  1  . diclofenac Sodium (VOLTAREN) 1 % GEL    Sig: Apply 2 g topically 4 (four) times daily.    Dispense:  150 g    Refill:  0     Follow up: 4 weeks    Remember: I will call with the results of your blood work and xray.   Should you have any questions or concerns please call the internal medicine clinic at [phone].      Tamsen Snider, M.D. [place]

## 2020-06-30 NOTE — Assessment & Plan Note (Signed)
BP: (!) 186/106  Patient presents with elevated blood pressure today.  He has not been taking hydrochlorothiazide or amlodipine because he has been out of the country.   Assessment: Hypertension, this is a chronic problem and uncontrolled at this time Plan: - olmesartan-hydrochlorothiazide (BENICAR HCT) 40-25 MG tablet; Take 1 tablet by mouth daily.  Dispense: 30 tablet; Refill: 1 - BMP8+Anion Gap

## 2020-06-30 NOTE — Assessment & Plan Note (Signed)
Patient has yellow, thick, brittle nails of left foot and lef hand.  He has tried over-the-counter medications in the past.  Appears there was a plan to treat with oral terbinafine per notes, but does not appear patient had follow-up.  Assessment: Onychomycosis .  Will refer to podiatry as patient will need frequent follow-up and monitoring of his LFTs to ensure resolution. Plan -Referral to podiatry

## 2020-06-30 NOTE — Progress Notes (Signed)
   CC: hypertension, foot and ankle pain, onychomycosis   HPI:[name] is a 54 y.o. male who presents for evaluation of hypertension, foot and ankle pain, and onychomycosis. Please see individual problem based A/P for details.   Past Medical History:  Diagnosis Date  . Chronic back pain   . Hyperlipidemia   . Hypertension    Review of Systems:   Review of Systems  Constitutional: Negative for chills and fever.  Musculoskeletal: Positive for joint pain. Negative for falls.  Neurological: Negative for dizziness and loss of consciousness.     Physical Exam: Vitals:   06/30/20 1441  BP: (!) 186/106  Pulse: 70  Temp: 98.1 F (36.7 C)  TempSrc: Oral  SpO2: 99%  Weight: 213 lb 12.8 oz (97 kg)   General: NAD, nl appearance HEENT: Normocephalic, atraumatic , Conjunctiva nl  Cardiovascular: Normal rate, regular rhythm.  No murmurs, rubs, or gallops Pulmonary : Equal breath sounds, No wheezes, rales, or rhonchi  Right ankle No gross deformity, mild swelling over lateral ankle, ecchymoses on plantar side and base of 1st metatarsal  FROM TTP over lateral malleolus and deep palpation over echymosies  NV intact distally.   Assessment & Plan:   See Encounters Tab for problem based charting.  Patient discussed with Dr. Philipp Ovens

## 2020-06-30 NOTE — Assessment & Plan Note (Signed)
Patient has had 5 days of right ankle swelling and pain over his lateral malleolus.  He is unsure how he injured his ankle, but says it may be due to dress shoes he has been wearing.  He usually wears tennis shoes and this has been the only change recently.  He denies any trauma to the ankle.  He is able to bear weight and swelling has improved since he has been wrapping it with an Ace bandage.  His biggest concern is the dark spot which developed 1 day ago on his right foot.  Nonblanching reddish-blue rash over first metatarsal tarsal on plantar side of right foot .   Assessment: Foot pain with associated ecchymosis, ankle pain with mild edema. Reassureing he can bear weight and in additon he denies any trauma to this foot/ankle. Patient has pain over posterior edge of the lateral malleolus and given ecchymosis on bottom foot I am concerned for some underlying fractures and will obtain plain films.  Plan: - diclofenac Sodium (VOLTAREN) 1 % GEL; Apply 2 g topically 4 (four) times daily.  Dispense: 150 g; Refill: 0 - DG Foot Complete Right; Future

## 2020-07-01 LAB — BMP8+ANION GAP
Anion Gap: 13 mmol/L (ref 10.0–18.0)
BUN/Creatinine Ratio: 14 (ref 9–20)
BUN: 10 mg/dL (ref 6–24)
CO2: 25 mmol/L (ref 20–29)
Calcium: 9.2 mg/dL (ref 8.7–10.2)
Chloride: 102 mmol/L (ref 96–106)
Creatinine, Ser: 0.74 mg/dL — ABNORMAL LOW (ref 0.76–1.27)
GFR calc Af Amer: 120 mL/min/{1.73_m2} (ref >59)
GFR calc non Af Amer: 104 mL/min/{1.73_m2} (ref >59)
Glucose: 84 mg/dL (ref 65–99)
Potassium: 4.4 mmol/L (ref 3.5–5.2)
Sodium: 140 mmol/L (ref 134–144)

## 2020-07-01 MED ORDER — DICLOFENAC SODIUM 1 % EX GEL: 2.0000 g | Freq: Four times a day (QID) | CUTANEOUS | 0 refills | Status: DC

## 2020-07-01 MED ORDER — OLMESARTAN MEDOXOMIL-HCTZ 40-25 MG PO TABS: 1.0000 | ORAL_TABLET | Freq: Every day | ORAL | 1 refills | Status: DC

## 2020-07-01 NOTE — Addendum Note (Signed)
Addended by: Lyndal Pulley on: 07/01/2020 05:07 PM   Modules accepted: Orders

## 2020-07-01 NOTE — Progress Notes (Signed)
Internal Medicine Clinic Attending  Case discussed with Dr. Steen  At the time of the visit.  We reviewed the resident's history and exam and pertinent patient test results.  I agree with the assessment, diagnosis, and plan of care documented in the resident's note.  

## 2020-07-01 NOTE — Progress Notes (Signed)
Called and updated patient on results.  Right foot x-ray showed no fracture.  BMP within normal limits.  Patient asked for medicine prescriptions to [facility] given the health department did not have the medications.  Prescription sent

## 2020-07-15 ENCOUNTER — Ambulatory Visit: Payer: Medicaid Other

## 2020-07-17 NOTE — Addendum Note (Signed)
Addended by: Hulan Fray on: 07/17/2020 07:33 AM   Modules accepted: Orders

## 2020-09-01 ENCOUNTER — Ambulatory Visit (INDEPENDENT_AMBULATORY_CARE_PROVIDER_SITE_OTHER): Payer: 59 | Admitting: Internal Medicine

## 2020-09-01 ENCOUNTER — Telehealth: Payer: Self-pay | Admitting: *Deleted

## 2020-09-01 ENCOUNTER — Encounter: Payer: Self-pay | Admitting: Internal Medicine

## 2020-09-01 VITALS — BP 130/73 | HR 64 | Ht 73.0 in | Wt 222.1 lb

## 2020-09-01 DIAGNOSIS — I10 Essential (primary) hypertension: Secondary | ICD-10-CM

## 2020-09-01 DIAGNOSIS — R42 Dizziness and giddiness: Secondary | ICD-10-CM

## 2020-09-01 DIAGNOSIS — R5383 Other fatigue: Secondary | ICD-10-CM

## 2020-09-01 NOTE — Telephone Encounter (Signed)
Thank you :)

## 2020-09-01 NOTE — Patient Instructions (Addendum)
Mr Edward Franklin,  It was a pleasure seeing you in clinic. Today we discussed:   Dizziness and fatigue: I am checking on lab work to figure out what is causing your symptoms. Your blood pressure is good. Please continue taking your medication as prescribed. I will call you with any abnormal results.   If you have any questions or concerns, please call our clinic at [phone] between 9am-5pm and after hours call [phone] and ask for the internal medicine resident on call. If you feel you are having a medical emergency please call 911.   Thank you, we look forward to helping you remain healthy!

## 2020-09-01 NOTE — Progress Notes (Signed)
   CC: dizzy and tired   HPI:  Edward Franklin is a 55 y.o. male with PMHx as listed below presenting for evaluation of dizziness and fatigue x 2 days. Also endorses headache for four days duration which improved with Tylenol and NyQuill. Endorses cough for 1-2 days that has improved. Denies any current headache or cough. Endorses rhinorrhea last week that is improved this week. Endorses good appetite. Denies any chest pain or dyspnea.  Endorses one episode of hematochezia 2-3 weeks ago. Denies any melena.  Please see problem based charting for complete assessment and plan.  Past Medical History:  Diagnosis Date  . Chronic back pain   . Hyperlipidemia   . Hypertension    Review of Systems:  Negative as stated in HPI.  Physical Exam:  Vitals:   09/01/20 1553  BP: 130/73  Pulse: 64  SpO2: 96%  Weight: 222 lb 1.6 oz (100.7 kg)  Height: 6\' 1"  (1.854 m)   Physical Exam  Constitutional: Appears well-developed and well-nourished. No distress.  HENT: Normocephalic and atraumatic, EOMI, conjunctiva normal, moist mucous membranes Cardiovascular: Normal rate, regular rhythm, S1 and S2 present, no murmurs, rubs, gallops.  Distal pulses intact Respiratory: No respiratory distress, no accessory muscle use. Lungs are clear to auscultation bilaterally. Musculoskeletal: Normal bulk and tone.  No peripheral edema noted. Neurological: Is alert and oriented x4, no apparent focal deficits noted. Skin: Warm and dry.  No rash, erythema, lesions noted.  Assessment & Plan:   See Encounters Tab for problem based charting.  Patient discussed with Dr. Daryll Drown

## 2020-09-01 NOTE — Telephone Encounter (Signed)
Pt's here as walk-in. Stated his BP is high; 146/?; c/o dizziness. No available appts until this afternoon. He's agreeable to come back - appt schedule today @ 1530PM.

## 2020-09-02 DIAGNOSIS — R42 Dizziness and giddiness: Secondary | ICD-10-CM | POA: Insufficient documentation

## 2020-09-02 LAB — CBC
Hematocrit: 38.7 % (ref 37.5–51.0)
Hemoglobin: 13.6 g/dL (ref 13.0–17.7)
MCH: 32.9 pg (ref 26.6–33.0)
MCHC: 35.1 g/dL (ref 31.5–35.7)
MCV: 94 fL (ref 79–97)
Platelets: 237 10*3/uL (ref 150–450)
RBC: 4.14 x10E6/uL (ref 4.14–5.80)
RDW: 11.6 % (ref 11.6–15.4)
WBC: 8.8 10*3/uL (ref 3.4–10.8)

## 2020-09-02 LAB — BMP8+ANION GAP
Anion Gap: 13 mmol/L (ref 10.0–18.0)
BUN/Creatinine Ratio: 16 (ref 9–20)
BUN: 11 mg/dL (ref 6–24)
CO2: 26 mmol/L (ref 20–29)
Calcium: 9.2 mg/dL (ref 8.7–10.2)
Chloride: 100 mmol/L (ref 96–106)
Creatinine, Ser: 0.68 mg/dL — ABNORMAL LOW (ref 0.76–1.27)
GFR calc Af Amer: 124 mL/min/{1.73_m2} (ref >59)
GFR calc non Af Amer: 108 mL/min/{1.73_m2} (ref >59)
Glucose: 99 mg/dL (ref 65–99)
Potassium: 4.2 mmol/L (ref 3.5–5.2)
Sodium: 139 mmol/L (ref 134–144)

## 2020-09-02 NOTE — Assessment & Plan Note (Signed)
BP Readings from Last 3 Encounters:  09/01/20 130/73  06/30/20 (!) 186/106  12/02/17 126/88   Patient is taking Olmesartan-HCTZ 40-25mg  daily. He endorses medication compliance. He endorses intermittent dizziness that usually lasts for about one week that resolves. He notes current episode of dizziness being approximately two days which he attributed to hypertension; however, on evaluation, BP 130/73 and orthostatics negative.   Plan: Continue Benicar 40-25mg  daily BMP at this visit

## 2020-09-02 NOTE — Assessment & Plan Note (Addendum)
Patient is presenting for evaluation of dizziness and fatigue for two days. He endorses and associated headache, nasal congestion, cough, and rhinorrhea for several days prior to this that improved with Tylenol and NyQuill. However, has had persistent fatigue and constant dizziness/lightheadedness. He denies any worsening of symptoms with standing vs sitting. He denies any hearing loss/tinnitus or ear pain, chest pain, dyspnea, palpitations, fever/chills, or decreased appetite. He does endorse that his friend had similar symptoms of congestion and chills last week. However, he did not get COVID tested.  Patient denies any melena but does endorse an isolated episode of hematochezia 2-3 weeks ago. He denies any abdominal pain. No family history of cancer. No significant findings on physical exam that would be suggestive of anemia. However, will evaluate with CBC and refer to GI for possible colonoscopy.  Patient believes that his antihypertensives may be contributing to his dizziness. Orthostatics during visit were wnl and no dizziness/lightheadedness with standing from seated position. Given that he has been on his antihypertensives for some time now with intermittent and nonspecific episodes of dizziness, I doubt that this is the cause. Will check BMP for any electrolyte abnormalities.  Also given recent upper respiratory symptoms, it is possible that his current episode of dizziness and fatigue may be secondary to acute viral infection. Will swab for COVID.   Plan:  - COVID testing - Ambulatory referral to GI - Continue to monitor; if persistent, may evaluate for BPPV

## 2020-09-05 NOTE — Progress Notes (Signed)
Internal Medicine Clinic Attending ° °Case discussed with Dr. Aslam  At the time of the visit.  We reviewed the resident’s history and exam and pertinent patient test results.  I agree with the assessment, diagnosis, and plan of care documented in the resident’s note.  °

## 2020-09-06 LAB — COVID-19, FLU A+B NAA

## 2020-09-08 ENCOUNTER — Telehealth: Payer: Self-pay | Admitting: Internal Medicine

## 2020-09-08 NOTE — Telephone Encounter (Signed)
Attempted to call patient to let him know COVID test was negative, unable to leave message voicemail is full

## 2020-09-08 NOTE — Telephone Encounter (Signed)
Pt requesting COVID results.

## 2020-09-11 ENCOUNTER — Encounter: Payer: Self-pay | Admitting: Internal Medicine

## 2020-09-11 ENCOUNTER — Ambulatory Visit (INDEPENDENT_AMBULATORY_CARE_PROVIDER_SITE_OTHER): Payer: 59 | Admitting: Internal Medicine

## 2020-09-11 ENCOUNTER — Other Ambulatory Visit: Payer: Self-pay

## 2020-09-11 VITALS — BP 162/93 | HR 72 | Temp 98.4°F | Ht 73.0 in | Wt 220.5 lb

## 2020-09-11 DIAGNOSIS — M79671 Pain in right foot: Secondary | ICD-10-CM

## 2020-09-11 DIAGNOSIS — Z1211 Encounter for screening for malignant neoplasm of colon: Secondary | ICD-10-CM

## 2020-09-11 DIAGNOSIS — K921 Melena: Secondary | ICD-10-CM | POA: Diagnosis not present

## 2020-09-11 LAB — POC HEMOCCULT BLD/STL (OFFICE/1-CARD/DIAGNOSTIC): Fecal Occult Blood, POC: NEGATIVE

## 2020-09-11 MED ORDER — DICLOFENAC SODIUM 1 % EX GEL: 2.0000 g | Freq: Four times a day (QID) | CUTANEOUS | 0 refills | Status: DC

## 2020-09-11 NOTE — Progress Notes (Unsigned)
   CC: Blood in stool  HPI:[name] is a 55 y.o. male who presents for evaluation of blood in his stool. Please see individual problem based A/P for details.   Past Medical History:  Diagnosis Date  . Chronic back pain   . Hyperlipidemia   . Hypertension    Review of Systems:   Review of Systems  Constitutional: Negative for chills, fever, malaise/fatigue and weight loss.  Gastrointestinal: Positive for blood in stool. Negative for constipation and diarrhea.  Neurological: Negative for dizziness and headaches.     Physical Exam: Vitals:   09/11/20 1536  BP: (!) 162/93  Pulse: 72  Temp: 98.4 F (36.9 C)  TempSrc: Oral  SpO2: 99%  Weight: 220 lb 8 oz (100 kg)  Height: 6\' 1"  (1.854 m)   Physical Exam Constitutional:      General: He is not in acute distress.    Appearance: He is not ill-appearing.  Cardiovascular:     Rate and Rhythm: Normal rate and regular rhythm.     Heart sounds: Normal heart sounds. No murmur heard.   Pulmonary:     Effort: Pulmonary effort is normal.     Breath sounds: Normal breath sounds.  Abdominal:     Palpations: Abdomen is soft. There is no mass.     Tenderness: There is no abdominal tenderness.  Genitourinary:    Rectum: Normal. Guaiac result negative.     Comments: Grade 1 internal hemorrhoid on dre Musculoskeletal:        General: No swelling or tenderness.  Skin:    General: Skin is warm and dry.        Assessment & Plan:   See Encounters Tab for problem based charting.  Patient discussed with Dr. Angelia Mould

## 2020-09-11 NOTE — Patient Instructions (Signed)
Thank you, Edward Franklin for allowing [place] to provide your care today. Today we discussed blood in your stool and high blood pressure.    I have ordered the following labs for you:  Lab Orders  No laboratory test(s) ordered today     Tests ordered today:   Referrals ordered today:   Referral Orders  No referral(s) requested today     I have ordered the following medication/changed the following medications:   Stop the following medications: There are no discontinued medications.   Start the following medications: No orders of the defined types were placed in this encounter.    Follow up: 2-3 months     Remember: I will call with the results of your test and order the appropriate colonoscopy ( screening/ or diagnostic ) when these results return.   Should you have any questions or concerns please call the internal medicine clinic at [phone].      Tamsen Snider, M.D. [place]

## 2020-09-12 ENCOUNTER — Encounter: Payer: Self-pay | Admitting: Internal Medicine

## 2020-09-12 DIAGNOSIS — K921 Melena: Secondary | ICD-10-CM | POA: Insufficient documentation

## 2020-09-12 NOTE — Assessment & Plan Note (Signed)
Referral sent for colonoscopy.  This is a screening colonoscopy

## 2020-09-12 NOTE — Assessment & Plan Note (Signed)
Patient is here for evaluation of 4 episodes of small amount of blood in his stool.  It is resolved last few days.  His point-of-care Hemoccult test is negative.  All approximated grade 1 internal hemorrhoid palpated on digital rectal exam.  Recommend sitz bath's and patient given information on hemorrhoids.  We are sending patient for screening colonoscopy because he is due.  Colonoscopy is not for further evaluation of this problem.

## 2020-09-15 ENCOUNTER — Encounter: Payer: Self-pay | Admitting: Physician Assistant

## 2020-09-15 ENCOUNTER — Telehealth: Payer: Self-pay

## 2020-09-15 NOTE — Progress Notes (Signed)
Internal Medicine Clinic Attending  Case discussed with Dr. Steen  At the time of the visit.  We reviewed the resident's history and exam and pertinent patient test results.  I agree with the assessment, diagnosis, and plan of care documented in the resident's note.  

## 2020-09-15 NOTE — Telephone Encounter (Signed)
Requesting lab results, please call pt back.  

## 2020-09-16 ENCOUNTER — Telehealth: Payer: Self-pay | Admitting: Internal Medicine

## 2020-09-16 NOTE — Telephone Encounter (Signed)
Pt is calling back regarding his results.

## 2020-09-16 NOTE — Telephone Encounter (Signed)
Attempted to call patient x4 to share results. FOBT negative. No further workup. Continue Sitz baths as discussed. I have put in a referral for screening colonoscopy.

## 2020-09-16 NOTE — Telephone Encounter (Signed)
Pt is calling back regarding results [phone]

## 2020-09-25 ENCOUNTER — Ambulatory Visit: Payer: 59 | Admitting: Physician Assistant

## 2020-09-25 ENCOUNTER — Encounter: Payer: Self-pay | Admitting: Physician Assistant

## 2020-09-25 VITALS — BP 138/80 | HR 79 | Ht 73.0 in | Wt 217.0 lb

## 2020-09-25 DIAGNOSIS — K625 Hemorrhage of anus and rectum: Secondary | ICD-10-CM

## 2020-09-25 DIAGNOSIS — Z1211 Encounter for screening for malignant neoplasm of colon: Secondary | ICD-10-CM

## 2020-09-25 MED ORDER — PLENVU 140 G PO SOLR: ORAL | 0 refills | Status: DC

## 2020-09-25 NOTE — Patient Instructions (Signed)
If you are age 56 or older, your body mass index should be between 23-30. Your Body mass index is 28.63 kg/m. If this is out of the aforementioned range listed, please consider follow up with your Primary Care Provider.  If you are age 3 or younger, your body mass index should be between 19-25. Your Body mass index is 28.63 kg/m. If this is out of the aformentioned range listed, please consider follow up with your Primary Care Provider.   You have been scheduled for a colonoscopy. Please follow written instructions given to you at your visit today.  Please pick up your prep supplies at the pharmacy within the next 1-3 days. If you use inhalers (even only as needed), please bring them with you on the day of your procedure.  Thank you for choosing me and [facility].  Ellouise Newer, PA-C

## 2020-09-25 NOTE — Progress Notes (Signed)
____________________________________________________________  Attending physician addendum:  Thank you for sending this case to me. I have reviewed the entire note and agree with the plan.   Abdel Effinger Danis, MD  ____________________________________________________________  

## 2020-09-25 NOTE — Progress Notes (Signed)
Chief Complaint: Hematochezia  HPI:    [name] is a 55 year old male with a past medical history as listed below, who was referred to me by [name], MD for a complaint of hematochezia.      09/11/2020 patient saw PCP in regards to blood in his stool. He had grade 1 internal hemorrhoids on DRE. He described that he had 4 episodes a small amount of blood in his stool which had resolved over a few days, Hemoccult test negative. Was recommended he do sitz bath's and given information about hemorrhoids.    Today, the patient tells me he had 3 days where he had a bowel movement saw some bright red blood on the stool about 2 weeks ago, since then he is seen no further blood.  Tells me that he does lift heavy objects at work.  Denies diarrhea or constipation.    Denies fever, chills, weight loss, change in bowel habits, abdominal pain, rectal pain or symptoms that awaken him from sleep.  Past Medical History:  Diagnosis Date  . Chronic back pain   . Hyperlipidemia   . Hypertension     No past surgical history on file.  Current Outpatient Medications  Medication Sig Dispense Refill  . diclofenac Sodium (VOLTAREN) 1 % GEL Apply 2 g topically 4 (four) times daily. 150 g 0  . olmesartan-hydrochlorothiazide (BENICAR HCT) 40-25 MG tablet Take 1 tablet by mouth daily. 30 tablet 1   No current facility-administered medications for this visit.    Allergies as of 09/25/2020 - Review Complete 09/12/2020  Allergen Reaction Noted  . Penicillins Rash 09/15/2015    No family history on file.  Social History   Socioeconomic History  . Marital status: Single    Spouse name: Not on file  . Number of children: Not on file  . Years of education: Not on file  . Highest education level: Not on file  Occupational History  . Not on file  Tobacco Use  . Smoking status: Current Every Day Smoker    Packs/day: 0.50    Types: Cigarettes  . Smokeless tobacco: Never Used  . Tobacco comment: 7-10 cigs  per day  Substance and Sexual Activity  . Alcohol use: Not on file  . Drug use: Not on file  . Sexual activity: Not on file  Other Topics Concern  . Not on file  Social History Narrative  . Not on file   Social Determinants of Health   Financial Resource Strain: Not on file  Food Insecurity: Not on file  Transportation Needs: Not on file  Physical Activity: Not on file  Stress: Not on file  Social Connections: Not on file  Intimate Partner Violence: Not on file    Review of Systems:    Constitutional: No weight loss, fever or chills Skin: No rash  Cardiovascular: No chest pain Respiratory: No SOB Gastrointestinal: See HPI and otherwise negative Genitourinary: No dysuria  Neurological: No headache, dizziness or syncope Musculoskeletal: No new muscle or joint pain Hematologic: No bruising Psychiatric: No history of depression or anxiety   Physical Exam:  Vital signs: BP 138/80   Pulse 79   Ht 6\' 1"  (1.854 m)   Wt 217 lb (98.4 kg)   BMI 28.63 kg/m   Constitutional:   Pleasant male appears to be in NAD, Well developed, Well nourished, alert and cooperative Head:  Normocephalic and atraumatic. Eyes:   PEERL, EOMI. No icterus. Conjunctiva pink. Ears:  Normal auditory acuity. Neck:  Supple Throat: Oral cavity and pharynx without inflammation, swelling or lesion.  Respiratory: Respirations even and unlabored. Lungs clear to auscultation bilaterally.   No wheezes, crackles, or rhonchi.  Cardiovascular: Normal S1, S2. No MRG. Regular rate and rhythm. No peripheral edema, cyanosis or pallor.  Gastrointestinal:  Soft, nondistended, nontender. No rebound or guarding. Normal bowel sounds. No appreciable masses or hepatomegaly. Rectal:  Not performed.  Msk:  Symmetrical without gross deformities. Without edema, no deformity or joint abnormality.  Neurologic:  Alert and  oriented x4;  grossly normal neurologically.  Skin:   Dry and intact without significant lesions or  rashes. Psychiatric: Demonstrates good judgement and reason without abnormal affect or behaviors.  RELEVANT LABS AND IMAGING: CBC    Component Value Date/Time   WBC 8.8 09/01/2020 1647   RBC 4.14 09/01/2020 1647   HGB 13.6 09/01/2020 1647   HCT 38.7 09/01/2020 1647   PLT 237 09/01/2020 1647   MCV 94 09/01/2020 1647   MCH 32.9 09/01/2020 1647   MCHC 35.1 09/01/2020 1647   RDW 11.6 09/01/2020 1647   LYMPHSABS 4.0 (H) 09/19/2017 1637   EOSABS 0.2 09/19/2017 1637   BASOSABS 0.1 09/19/2017 1637    CMP     Component Value Date/Time   NA 139 09/01/2020 1647   K 4.2 09/01/2020 1647   CL 100 09/01/2020 1647   CO2 26 09/01/2020 1647   GLUCOSE 99 09/01/2020 1647   BUN 11 09/01/2020 1647   CREATININE 0.68 (L) 09/01/2020 1647   CALCIUM 9.2 09/01/2020 1647   PROT 7.0 12/28/2017 1512   ALBUMIN 4.3 12/28/2017 1512   AST 12 12/28/2017 1512   ALT 17 12/28/2017 1512   ALKPHOS 57 12/28/2017 1512   BILITOT 0.5 12/28/2017 1512   GFRNONAA 108 09/01/2020 1647   GFRAA 124 09/01/2020 1647    Assessment: 1.  Screening for colorectal cancer: Patient over the age of 14 and overdue for screening 2.  Rectal bleeding: Evaluation by PCP with hemorrhoids which were most likely the cause aince this has since stopped  Plan: 1.  Scheduled patient for screening colonoscopy in the [place] with Dr. Loletha Carrow.  Patient was provided with a detailed list of risks for the procedure and he agrees to proceed. 2.  Patient to follow in clinic per recommendations from Dr. Loletha Carrow after time of procedure.  Ellouise Newer, PA-C [facility] 09/25/2020, 1:20 PM  Cc: [name], MD

## 2020-10-07 ENCOUNTER — Telehealth: Payer: Self-pay

## 2020-10-07 MED ORDER — OLMESARTAN MEDOXOMIL-HCTZ 40-25 MG PO TABS: 1.0000 | ORAL_TABLET | Freq: Every day | ORAL | 0 refills | Status: DC

## 2020-10-07 NOTE — Telephone Encounter (Signed)
Pls contact pt regarding medicine [phone]

## 2020-10-07 NOTE — Telephone Encounter (Signed)
Return pt's call - stated he's going overseas and needs refill on his blood pressure medication for 3 months. Med is not on his current med list. Send rx to [facility].

## 2020-10-07 NOTE — Telephone Encounter (Signed)
See Dr Lonzo Candy telephone encounter 2/8.

## 2020-10-07 NOTE — Telephone Encounter (Signed)
Benicar-hctz 40-25mg  daily 3 month supply sent to pharmacy

## 2020-11-06 ENCOUNTER — Encounter: Payer: 59 | Admitting: Gastroenterology

## 2021-01-26 ENCOUNTER — Other Ambulatory Visit: Payer: Self-pay | Admitting: Internal Medicine

## 2021-01-26 NOTE — Telephone Encounter (Signed)
Front desk--please arrange a visit with Dr. Court Joy when he is back in clinic. Thanks

## 2021-03-12 ENCOUNTER — Encounter: Payer: 59 | Admitting: Internal Medicine

## 2021-03-19 ENCOUNTER — Telehealth: Payer: Self-pay | Admitting: *Deleted

## 2021-03-19 ENCOUNTER — Encounter: Payer: 59 | Admitting: Student

## 2021-03-19 NOTE — Telephone Encounter (Signed)
Walk-in; requesting an appt. Stated he was in [place] and had a stroke on June 20th. He has paperwork with him. Offered an appt today w/Dr Eulas Post since Dr Court Joy has no open appts. Stated he prefers to see Dr Court Joy. I asked if he has any urgent concerns; he stated no. Appt given Monday 8/15 @ 0945 AM. He's aware to bring the paperwork back with him

## 2021-03-23 ENCOUNTER — Ambulatory Visit (INDEPENDENT_AMBULATORY_CARE_PROVIDER_SITE_OTHER): Payer: 59 | Admitting: Internal Medicine

## 2021-03-23 ENCOUNTER — Encounter: Payer: Self-pay | Admitting: Internal Medicine

## 2021-03-23 VITALS — BP 121/78 | HR 66 | Wt 211.1 lb

## 2021-03-23 DIAGNOSIS — I10 Essential (primary) hypertension: Secondary | ICD-10-CM | POA: Diagnosis not present

## 2021-03-23 DIAGNOSIS — I358 Other nonrheumatic aortic valve disorders: Secondary | ICD-10-CM | POA: Diagnosis not present

## 2021-03-23 DIAGNOSIS — E785 Hyperlipidemia, unspecified: Secondary | ICD-10-CM

## 2021-03-23 MED ORDER — OLMESARTAN MEDOXOMIL-HCTZ 40-25 MG PO TABS: 1.0000 | ORAL_TABLET | Freq: Every day | ORAL | 3 refills | Status: DC

## 2021-03-23 MED ORDER — ASPIRIN EC 81 MG PO TBEC: 81.0000 mg | DELAYED_RELEASE_TABLET | Freq: Every day | ORAL | 3 refills | Status: DC

## 2021-03-23 MED ORDER — CLOPIDOGREL BISULFATE 75 MG PO TABS: 75.0000 mg | ORAL_TABLET | Freq: Every day | ORAL | 3 refills | Status: DC

## 2021-03-23 MED ORDER — ATORVASTATIN CALCIUM 40 MG PO TABS: 40.0000 mg | ORAL_TABLET | Freq: Every day | ORAL | 3 refills | Status: DC

## 2021-03-23 NOTE — Assessment & Plan Note (Signed)
Patient's BP today is 121/78 with a goal of <130/90.  The patient endorses adherence to his medication regimen. He denies lightheadedness, weakness, dizziness on standing, swelling in the feet or ankles.   Assessment/Plan: HTN, at goal. Refilled Benicar today.  - olmesartan-hydrochlorothiazide (BENICAR HCT) 40-25 MG tablet; Take 1 tablet by mouth daily.  Dispense: 90 tablet; Refill: 3

## 2021-03-23 NOTE — Assessment & Plan Note (Signed)
  Patient's BP today is 121/78 with a goal of <130/90.  The patient endorses adherence to his medication regimen. He denies lightheadedness, weakness, dizziness on standing, swelling in the feet or ankles.   Assessment/Plan: HTN, at goal. Refilled Benicar today.  - olmesartan-hydrochlorothiazide (BENICAR HCT) 40-25 MG tablet; Take 1 tablet by mouth daily.  Dispense: 90 tablet; Refill: 3  Fasting lipid panel. LDL 126, HDL 33, TG 171  Assessment/Plan: Hyperlipidemia. Will target LDL <70 given possible stroke and atherosclerotic disease on carotid ultrasound.  - atorvastatin (LIPITOR) 40 MG tablet; Take 1 tablet (40 mg total) by mouth daily.  Dispense: 90 tablet; Refill: 3

## 2021-03-23 NOTE — Assessment & Plan Note (Signed)
Edward Franklin presents with news of a recent hospital admission while out of state working. He developed left hand weakness while at work. Workup can be review under care everywhere and specifically [facility] and [facility]. MRI did not show an acute stroke but did show encephalomalacia with surrounding gliosis in the right parietal lobe. 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 Will refer patient to cardiology to establish care and recommendations on continued treatment/needed monitoring of Lambl's excrescens. Patient will return for fasting lipid panel , this is follow up since starting atorvastatin 40 mg. Refilled medications as below.    - aspirin EC 81 MG tablet; Take 1 tablet (81 mg total) by mouth daily. Swallow whole.  Dispense: 90 tablet; Refill: 3 - clopidogrel (PLAVIX) 75 MG tablet; Take 1 tablet (75 mg total) by mouth daily.  Dispense: 90 tablet; Refill: 3 - Ambulatory referral to Cardiology - CMP14 + Anion Gap; Future - Lipid Profile; Future

## 2021-03-23 NOTE — Patient Instructions (Signed)
Thank you for trusting me with your care. 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

## 2021-03-23 NOTE — Progress Notes (Signed)
   CC: Hypertension , Hyperlipidemia, Lambl's excrescence   HPI:[name] is a 55 y.o. 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 Please see individual problem based A/P for details.  Past Medical History:  Diagnosis Date   Chronic back pain    Hyperlipidemia    Hypertension    Review of Systems:   Review of Systems  Constitutional:  Negative for chills and fever.  Cardiovascular:  Negative for chest pain and palpitations.  Neurological:  Positive for sensory change and weakness. Negative for dizziness and headaches.    Physical Exam: Vitals:   03/23/21 1007  BP: 121/78  Pulse: 66  SpO2: 100%  Weight: 211 lb 1.6 oz (95.8 kg)   General: Well kept, well appearing and NAD, overweight BMI HEENT: Conjunctiva nl , antiicteric sclerae, moist mucous membranes, no exudate or erythema Cardiovascular: Normal rate, regular rhythm.  No murmurs, rubs, or gallops Pulmonary : Equal breath sounds, No wheezes, rales, or rhonchi Abdominal: soft, nontender,  bowel sounds present Ext: No edema in lower extremities, no tenderness to palpation of lower extremities.  Mental Status: Patient is awake, alert, oriented x3 No signs of aphasia or neglect Cranial Nerves: II: Pupils equal, round, and reactive to light.   III,IV, VI: EOMI without ptosis or diploplia.  V: Facial sensation is symmetric to light touch  VII: Facial movement is symmetric.  VIII: hearing is intact to voice X: Uvula elevates symmetrically XI: Shoulder shrug is symmetric. XII: tongue is midline without atrophy or fasciculations.  Motor:  4/5 strength with left hand grip. LE 5/5, strength at elbow and proximal bilateral UE 5/5 Sensory: Sensation ion left hand is decreased on dorsal side of all fingers with exception of middle finger, sensation intact otherwise to light touch in UE and LE Deep Tendon Reflexes: 2+ patellar, biceps ,tricep, brachioradialis  Cerebellar: Finger-Nose  intact bilaterally   Assessment & Plan:   See Encounters Tab for problem based charting.  Patient discussed with Dr. Dareen Piano

## 2021-03-25 NOTE — Progress Notes (Signed)
Internal Medicine Clinic Attending  Case discussed with Dr. Steen  At the time of the visit.  We reviewed the resident's history and exam and pertinent patient test results.  I agree with the assessment, diagnosis, and plan of care documented in the resident's note.  

## 2021-04-02 ENCOUNTER — Encounter: Payer: Self-pay | Admitting: Cardiology

## 2021-04-02 ENCOUNTER — Ambulatory Visit: Payer: 59 | Admitting: Cardiology

## 2021-04-02 ENCOUNTER — Other Ambulatory Visit: Payer: Self-pay

## 2021-04-02 VITALS — BP 124/84 | HR 63 | Temp 97.1°F | Resp 17 | Ht 73.0 in | Wt 213.0 lb

## 2021-04-02 DIAGNOSIS — E782 Mixed hyperlipidemia: Secondary | ICD-10-CM

## 2021-04-02 DIAGNOSIS — I472 Ventricular tachycardia: Secondary | ICD-10-CM

## 2021-04-02 DIAGNOSIS — I63411 Cerebral infarction due to embolism of right middle cerebral artery: Secondary | ICD-10-CM

## 2021-04-02 DIAGNOSIS — I4729 Other ventricular tachycardia: Secondary | ICD-10-CM

## 2021-04-02 DIAGNOSIS — I358 Other nonrheumatic aortic valve disorders: Secondary | ICD-10-CM

## 2021-04-02 DIAGNOSIS — I10 Essential (primary) hypertension: Secondary | ICD-10-CM

## 2021-04-02 NOTE — Progress Notes (Signed)
Primary Physician/Referring:  [name], MD  Patient ID: Edward Franklin, male    DOB: 1965/11/01, 55 y.o.   MRN: [id number]  Chief Complaint  Patient presents with   New Patient (Initial Visit)   lambl excrescence on aortic valve   HPI:    [name]  is a 55 y.o. Male patient with mixed hyperlipidemia, tobacco use disorder, hypertension, presented to the hospital in [place] with acute onset left hand numbness and weakness on 01/26/2021 and was diagnosed with embolic stroke.  MRI did not reveal any new stroke however it was felt that his symptoms are classic for small TIA/stroke and embolic phenomena was felt to be the etiology, telemetry had revealed 2 episodes of possible NSVT versus slow A. fib with aberrancy.  TEE demonstrated lamberts excrescence without any intracardiac shunting.  He was discharged home on home physical therapy, aspirin and Plavix.  He still has mild residual defect in his left arm, left hand, otherwise he is feeling well.  Remains asymptomatic, has been walking for at least 30 to 40 minutes daily slowly without any chest pain or dyspnea.  He is tolerating all his medications well.  He has quit smoking cigarettes since stroke.  Past Medical History:  Diagnosis Date   Chronic back pain    Hyperlipidemia    Hypertension    Past Surgical History:  Procedure Laterality Date   APPENDECTOMY     HERNIA REPAIR     Family History  Problem Relation Age of Onset   Colon cancer Neg Hx    Esophageal cancer Neg Hx    Pancreatic cancer Neg Hx     Social History   Tobacco Use   Smoking status: Former    Packs/day: 0.50    Years: 30.00    Pack years: 15.00    Types: Cigarettes    Quit date: 01/27/2021    Years since quitting: 0.1   Smokeless tobacco: Never   Tobacco comments:    7-10 cigs per day  Substance Use Topics   Alcohol use: Not Currently    Alcohol/week: 0.0 standard drinks   Marital Status: Married  ROS  Review of Systems  Cardiovascular:  Negative  for chest pain, dyspnea on exertion and leg swelling.  Gastrointestinal:  Negative for melena.  Neurological:  Positive for focal weakness (left hand clumsiness).  Objective  Blood pressure 124/84, pulse 63, temperature (!) 97.1 F (36.2 C), temperature source Temporal, resp. rate 17, height '6\' 1"'$  (1.854 m), weight 213 lb (96.6 kg), SpO2 96 %. Body mass index is 28.1 kg/m.  Vitals with BMI 04/02/2021 03/23/2021 09/25/2020  Height '6\' 1"'$  - '6\' 1"'$   Weight 213 lbs 211 lbs 2 oz 217 lbs  BMI [id number] - [id number]  Systolic [id number]  Diastolic 84 78 80  Pulse 63 66 79    Physical Exam Neck:     Vascular: No carotid bruit or JVD.  Cardiovascular:     Rate and Rhythm: Normal rate and regular rhythm.     Pulses: Intact distal pulses.     Heart sounds: Normal heart sounds. No murmur heard.   No gallop.  Pulmonary:     Effort: Pulmonary effort is normal.     Breath sounds: Normal breath sounds.  Abdominal:     General: Bowel sounds are normal.     Palpations: Abdomen is soft.  Musculoskeletal:        General: No swelling.     Laboratory examination:   Recent Labs  06/30/20 1532 09/01/20 1647  NA 140 139  K 4.4 4.2  CL 102 100  CO2 25 26  GLUCOSE 84 99  BUN 10 11  CREATININE 0.74* 0.68*  CALCIUM 9.2 9.2  GFRNONAA 104 108  GFRAA 120 124   CrCl cannot be calculated (Patient's most recent lab result is older than the maximum 21 days allowed.).  CMP Latest Ref Rng & Units 09/01/2020 06/30/2020 12/28/2017  Glucose 65 - 99 mg/dL 99 84 108(H)  BUN 6 - 24 mg/dL '11 10 11  '$ Creatinine 0.76 - 1.27 mg/dL 0.68(L) 0.74(L) 0.73(L)  Sodium 134 - 144 mmol/L 139 140 140  Potassium 3.5 - 5.2 mmol/L 4.2 4.4 4.2  Chloride 96 - 106 mmol/L 100 102 102  CO2 20 - 29 mmol/L '26 25 25  '$ Calcium 8.7 - 10.2 mg/dL 9.2 9.2 9.6  Total Protein 6.0 - 8.5 g/dL - - 7.0  Total Bilirubin 0.0 - 1.2 mg/dL - - 0.5  Alkaline Phos 39 - 117 IU/L - - 57  AST 0 - 40 IU/L - - 12  ALT 0 - 44 IU/L - - 17   CBC Latest Ref  Rng & Units 09/01/2020 09/19/2017 09/15/2015  WBC 3.4 - 10.8 x10E3/uL 8.8 10.3 10.5  Hemoglobin 13.0 - 17.7 g/dL 13.6 15.3 14.9  Hematocrit 37.5 - 51.0 % 38.7 44.3 43.2  Platelets 150 - 450 x10E3/uL 237 286 316    Lipid Panel No results for input(s): CHOL, TRIG, LDLCALC, VLDL, HDL, CHOLHDL, LDLDIRECT in the last 8760 hours. Lipid Panel     Component Value Date/Time   CHOL 201 (H) 09/12/2017 1106   TRIG 317 (H) 09/12/2017 1106   HDL 31 (L) 09/12/2017 1106   CHOLHDL 6.5 (H) 09/12/2017 1106   LDLCALC 107 (H) 09/12/2017 1106   LABVLDL 63 (H) 09/12/2017 1106     HEMOGLOBIN A1C No results found for: HGBA1C, MPG TSH No results for input(s): TSH in the last 8760 hours.  Medications and allergies   Allergies  Allergen Reactions   Penicillins Rash    Passes out as well     Medication prior to this encounter:   Outpatient Medications Prior to Visit  Medication Sig Dispense Refill   aspirin EC 81 MG tablet Take 1 tablet (81 mg total) by mouth daily. Swallow whole. 90 tablet 3   atorvastatin (LIPITOR) 40 MG tablet Take 1 tablet (40 mg total) by mouth daily. 90 tablet 3   clopidogrel (PLAVIX) 75 MG tablet Take 1 tablet (75 mg total) by mouth daily. 90 tablet 3   olmesartan-hydrochlorothiazide (BENICAR HCT) 40-25 MG tablet Take 1 tablet by mouth daily. 90 tablet 3   No facility-administered medications prior to visit.     Medication list after today's encounter   Current Outpatient Medications  Medication Instructions   aspirin EC 81 mg, Oral, Daily, Swallow whole.   atorvastatin (LIPITOR) 40 mg, Oral, Daily   clopidogrel (PLAVIX) 75 mg, Oral, Daily   olmesartan-hydrochlorothiazide (BENICAR HCT) 40-25 MG tablet 1 tablet, Oral, Daily    Radiology:   No results found.  Cardiac Studies:   Event monitor 30 days March 07, 2021: Predominant rhythm is normal sinus rhythm.  Patient activated events, 60 number revealed normal sinus rhythm.  There were 6 NSVT episodes, longest 6 beats.   There was no atrial fibrillation.  Rare PACs and PVCs.  TEE 01/30/2021: Normal LV systolic function, Lambros excrescence on the aortic valve. No significant valvular abnormality. No hypermobile elements in the aortic arch, no intracardiac shunting  by agitated saline injection.  Carotid artery duplex 01/27/2021:  Velocities correspond to <50% stenosis of the right ICA and left ICA. Bilateral antegrade vertebral artery flow.  ** Personally reviewed the report, minimal stenosis in the carotid arteries with peak velocity of 91 cm/s.**  EKG:   EKG 04/02/2021: Normal sinus rhythm at the rate of 63 bpm, leftward axis, incomplete right bundle branch block.  LVH.    Assessment     ICD-10-CM   1. 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 Cerebrovascular accident (CVA) due to embolism of right middle cerebral artery ([place]) 01/26/2021  I63.411     3. NSVT (nonsustained ventricular tachycardia) (HCC)  I47.2 PCV MYOCARDIAL PERFUSION WO LEXISCAN    4. Primary hypertension  I10 EKG 12-Lead    5. Mixed hyperlipidemia  E78.2        There are no discontinued medications.  No orders of the defined types were placed in this encounter.  Orders Placed This Encounter  Procedures   PCV MYOCARDIAL PERFUSION WO LEXISCAN    Standing Status:   Future    Standing Expiration Date:   06/02/2021   EKG 12-Lead   Recommendations:   Nyko Verdun is a 55 y.o. Male patient with mixed hyperlipidemia, tobacco use disorder, hypertension, presented to the hospital in [place] with acute onset left hand numbness and weakness on 01/26/2021 and was diagnosed with embolic stroke.  MRI did not reveal any new stroke however it was felt that his symptoms are classic for small TIA/stroke and embolic phenomena was felt to be the etiology, telemetry had revealed 2 episodes of possible NSVT versus slow A. fib with aberrancy.  TEE demonstrated lamberts excrescence without any intracardiac shunting.  He was discharged home  on home physical therapy, aspirin and Plavix.  He has remained abstinent from tobacco use since the stroke.  Except for mild left arm clumsiness and mild weakness, fortunately he has recuperated well from stroke.  I reviewed his external echocardiogram/TEE and also event monitoring report.  I do not have images however presence of lamberts excrescence does increase the risk of stroke, it is felt that his stroke was small and probably embolic.  There was no intracardiac shunting.  There was no atrial fibrillation on 30-day event monitor.  I have recommended continuing dual antiplatelet therapy for now, however we could discontinue aspirin probably in 1 to 2 months, which I will do so on his next office visit.  With regard to cardiovascular risk factors, he is now on high intensity high-dose statins for mixed hyperlipidemia, I have discussed with him regarding making lifestyle changes with regard to his diet and reducing his calories and avoidance of excess carbohydrates.  Suspect his lipids will improve and I will tag along with his PCP for monitoring this.  Could consider addition of LPA on his next lipid profile testing.  With regard to NSVT noted on telemetry and also on 30-day event monitor, underlying CAD needs to be excluded.  We will schedule him for exercise nuclear stress test.  Unless this is high risk, I will see him back in 2 months for follow-up.     Adrian Prows, MD, [facility] 04/02/2021, 12:35 PM Office: [phone]

## 2021-05-04 ENCOUNTER — Other Ambulatory Visit: Payer: Self-pay

## 2021-05-04 ENCOUNTER — Ambulatory Visit: Payer: 59

## 2021-05-04 DIAGNOSIS — I358 Other nonrheumatic aortic valve disorders: Secondary | ICD-10-CM

## 2021-05-04 DIAGNOSIS — I4729 Other ventricular tachycardia: Secondary | ICD-10-CM

## 2021-05-04 DIAGNOSIS — I472 Ventricular tachycardia: Secondary | ICD-10-CM

## 2021-05-04 DIAGNOSIS — I10 Essential (primary) hypertension: Secondary | ICD-10-CM

## 2021-05-04 DIAGNOSIS — E785 Hyperlipidemia, unspecified: Secondary | ICD-10-CM

## 2021-05-04 LAB — PCV MYOCARDIAL PERFUSION WO LEXISCAN
Angina Index: 0
ST Depression (mm): 0 mm

## 2021-05-04 MED ORDER — OLMESARTAN MEDOXOMIL-HCTZ 40-25 MG PO TABS
1.0000 | ORAL_TABLET | Freq: Every day | ORAL | 3 refills | Status: AC
Start: 2021-05-04 — End: ?

## 2021-05-04 MED ORDER — CLOPIDOGREL BISULFATE 75 MG PO TABS: 75.0000 mg | ORAL_TABLET | Freq: Every day | ORAL | 3 refills | Status: AC

## 2021-05-04 MED ORDER — ASPIRIN EC 81 MG PO TBEC
81.0000 mg | DELAYED_RELEASE_TABLET | Freq: Every day | ORAL | 3 refills | Status: AC
Start: 2021-05-04 — End: 2022-05-04

## 2021-05-04 MED ORDER — ATORVASTATIN CALCIUM 40 MG PO TABS
40.0000 mg | ORAL_TABLET | Freq: Every day | ORAL | 3 refills | Status: AC
Start: 2021-05-04 — End: 2022-05-04

## 2021-05-05 NOTE — Progress Notes (Signed)
Called and spoke with patient regarding his stress test results.

## 2021-05-05 NOTE — Progress Notes (Signed)
Normal stress test

## 2021-05-18 ENCOUNTER — Ambulatory Visit: Payer: 59 | Admitting: Cardiology

## 2021-06-02 ENCOUNTER — Ambulatory Visit: Payer: 59 | Admitting: Cardiology

## 2022-04-08 ENCOUNTER — Other Ambulatory Visit: Payer: Self-pay | Admitting: Cardiology

## 2022-04-08 DIAGNOSIS — I358 Other nonrheumatic aortic valve disorders: Secondary | ICD-10-CM

## 2022-04-08 DIAGNOSIS — E785 Hyperlipidemia, unspecified: Secondary | ICD-10-CM
